# Patient Record
Sex: Female | Born: 1995 | Race: White | Hispanic: No | Marital: Single | Smoking: Former smoker
Health system: Southern US, Community
[De-identification: ages and names within clinical notes are randomized; demographics above are authoritative.]

## PROBLEM LIST (undated history)

## (undated) DIAGNOSIS — Z789 Other specified health status: Secondary | ICD-10-CM

## (undated) DIAGNOSIS — Z8659 Personal history of other mental and behavioral disorders: Secondary | ICD-10-CM

## (undated) DIAGNOSIS — F32A Depression, unspecified: Secondary | ICD-10-CM

## (undated) DIAGNOSIS — Z8759 Personal history of other complications of pregnancy, childbirth and the puerperium: Secondary | ICD-10-CM

## (undated) DIAGNOSIS — D649 Anemia, unspecified: Secondary | ICD-10-CM

## (undated) DIAGNOSIS — F419 Anxiety disorder, unspecified: Secondary | ICD-10-CM

## (undated) DIAGNOSIS — F4310 Post-traumatic stress disorder, unspecified: Secondary | ICD-10-CM

## (undated) HISTORY — DX: Personal history of other mental and behavioral disorders: Z87.59

## (undated) HISTORY — DX: Personal history of other mental and behavioral disorders: Z86.59

## (undated) HISTORY — DX: Post-traumatic stress disorder, unspecified: F43.10

## (undated) HISTORY — DX: Other specified health status: Z78.9

## (undated) HISTORY — PX: INDUCED ABORTION: SHX677

## (undated) HISTORY — DX: Depression, unspecified: F32.A

## (undated) HISTORY — DX: Anxiety disorder, unspecified: F41.9

## (undated) NOTE — *Deleted (*Deleted)
History     CSN: [id number]  Arrival date and time: 08/07/20 1331   First Provider Initiated Contact with Patient 08/07/20 1451      Chief Complaint  Patient presents with  . Vaginal Bleeding   HPI  Patient is G4P2 at [redacted]w[redacted]d presents for vaginal bleeding. Patient was diagnosed with placental abruption on 08/01/2020 in [facility]U. Patient reports she started having vaginal bleeding yesterday that progressively worsened last night and noted passing large clots. Patient notes she changed her pad 4 times throughout the night last night and twice today. She states the bleeding has slowed down but she still continues to have bleeding. Patient reports mild lower abdominal pain that radiates to her lower back. Patient denies dizziness, HA, nausea, vomiting, dysuria, frequency, or leg swelling. Patient notes she has recently felt flutters from the baby and continues to feel the movement.  OB History     Gravida  4   Para  2   Term  2   Preterm  0   AB  1   Living  2      SAB  0   TAB  1   Ectopic  0   Multiple  0   Live Births  2        Obstetric Comments  C/s 1- breech C/s 2 attempted TOLAC baby did not tolerate         Past Medical History:  Diagnosis Date  . Anemia   . Medical history non-contributory     Past Surgical History:  Procedure Laterality Date  . CESAREAN SECTION    . CESAREAN SECTION N/A 06/04/2019   Procedure: CESAREAN SECTION;  Surgeon: [place] Bing, MD;  Location: [facility]S;  Service: Obstetrics;  Laterality: N/A;  . INDUCED ABORTION      Family History  Problem Relation Age of Onset  . Healthy Mother   . Healthy Father     Social History   Tobacco Use  . Smoking status: Former Smoker    Packs/day: 0.25    Quit date: 06/01/2020    Years since quitting: 0.1  . Smokeless tobacco: Never Used  Vaping Use  . Vaping Use: Never used  Substance Use Topics  . Alcohol use: Never  . Drug use: Never    Allergies:  Allergies  Allergen  Reactions  . Sulfa Antibiotics Hives  . Nickel Rash    Medications Prior to Admission  Medication Sig Dispense Refill Last Dose  . acetaminophen (TYLENOL) 325 MG tablet Take 650 mg by mouth as needed (pain).    Past Week at Unknown time  . Prenatal Vit-Fe Fumarate-FA (PRENATAL MULTIVITAMIN) TABS tablet Take 1 tablet by mouth daily at 12 noon.   08/07/2020 at Unknown time    Review of Systems  Constitutional: Negative for fever.  Respiratory: Negative for shortness of breath.   Cardiovascular: Negative for chest pain and leg swelling.  Gastrointestinal: Positive for abdominal pain. Negative for diarrhea, nausea and vomiting.  Genitourinary: Positive for pelvic pain and vaginal bleeding. Negative for dysuria, flank pain, frequency, vaginal discharge and vaginal pain.  Musculoskeletal: Positive for back pain (Low back pain).  Neurological: Negative for dizziness and headaches.   Physical Exam   Blood pressure 103/66, pulse 78, temperature 98 F (36.7 C), resp. rate 18, weight 56.2 kg, last menstrual period 02/23/2020, SpO2 100 %, unknown if currently breastfeeding.  Physical Exam Exam conducted with a chaperone present.  Constitutional:      Appearance: Normal appearance.  HENT:  Head: Normocephalic and atraumatic.  Pulmonary:     Effort: Pulmonary effort is normal.  Abdominal:     General: There is no distension.     Palpations: Abdomen is soft.     Tenderness: There is abdominal tenderness. There is no right CVA tenderness or left CVA tenderness.  Genitourinary:    General: Normal vulva.     Cervix: Erythema and cervical bleeding present. No friability.  Musculoskeletal:     Cervical back: Normal range of motion and neck supple.     Right lower leg: No edema.     Left lower leg: No edema.  Skin:    General: Skin is warm and dry.  Neurological:     General: No focal deficit present.     Mental Status: She is alert and oriented to person, place, and time.   Psychiatric:        Mood and Affect: Mood normal.        Behavior: Behavior normal.     MAU Course  Procedures Results for orders placed or performed during the hospital encounter of 08/07/20 (from the past 24 hour(s))  Urinalysis, Routine w reflex microscopic Urine, Clean Catch     Status: Abnormal   Collection Time: 08/07/20  2:20 PM  Result Value Ref Range   Color, Urine YELLOW YELLOW   APPearance HAZY (A) CLEAR   Specific Gravity, Urine 1.028 1.005 - 1.030   pH 5.0 5.0 - 8.0   Glucose, UA NEGATIVE NEGATIVE mg/dL   Hgb urine dipstick MODERATE (A) NEGATIVE   Bilirubin Urine NEGATIVE NEGATIVE   Ketones, ur NEGATIVE NEGATIVE mg/dL   Protein, ur NEGATIVE NEGATIVE mg/dL   Nitrite NEGATIVE NEGATIVE   Leukocytes,Ua NEGATIVE NEGATIVE   RBC / HPF >50 (H) 0 - 5 RBC/hpf   WBC, UA 0-5 0 - 5 WBC/hpf   Bacteria, UA NONE SEEN NONE SEEN   Squamous Epithelial / LPF 0-5 0 - 5   Mucus PRESENT   Wet prep, genital     Status: Abnormal   Collection Time: 08/07/20  3:08 PM  Result Value Ref Range   Yeast Wet Prep HPF POC NONE SEEN NONE SEEN   Trich, Wet Prep NONE SEEN NONE SEEN   Clue Cells Wet Prep HPF POC PRESENT (A) NONE SEEN   WBC, Wet Prep HPF POC MANY (A) NONE SEEN   Sperm NONE SEEN   CBC     Status: Abnormal   Collection Time: 08/07/20  3:35 PM  Result Value Ref Range   WBC 12.6 (H) 4.0 - 10.5 K/uL   RBC 4.09 3.87 - 5.11 MIL/uL   Hemoglobin 9.2 (L) 12.0 - 15.0 g/dL   HCT 40.1 (L) 36 - 46 %   MCV 74.3 (L) 80.0 - 100.0 fL   MCH 22.5 (L) 26.0 - 34.0 pg   MCHC 30.3 30.0 - 36.0 g/dL   RDW 02.7 (H) 25.3 - 66.4 %   Platelets 312 150 - 400 K/uL   nRBC 0.0 0.0 - 0.2 %   MDM Patient presents with vaginal bleeding and history of known placental abruption.  UA showed presence of RBCs. Wet prep completed and showed clue cells indicating patient has BV. GC/chlamydia results pending. CBC shows Hgb stable at 9.2 as compared to 9.1 six days ago.   [place] completed to evaluate bleeding and  placental abruption. [place] final results not completed, but preliminary results showed oligohydramnios with largest pocket of fluid being 1.5cm.   Assessment and Plan  1. Vaginal  bleeding with known placental abruption. 2. Bacterial vaginosis  Dr. Shawnie Pons consulted suggested to inform the patient of the preliminary ultrasound findings and tell her that her water has most likely broken, and pregnancy is at a high risk for spontaneous abortion.  Patient is to be instructed to follow up with a provider in office tomorrow to review final [place] results. Patient needs to follow up in one week for repeat ultrasound. Patient to be instructed to return to MAU if bleeding or vaginal discharge increases or if she passes more clots. Patient to be treated with Flagyl 500mg  BID x7days for BV.  Patient left AMA before instructions were given to her after extensive conversation about the importance of waiting for the results as the preliminary findings are concerning for abnormal findings.  [place], Gigi Gin- Student 08/07/2020, 4:50 PM   Attestation of Supervision of Student:  I confirm that I have verified the information documented in the physician assistant student's note and that I have also personally reperformed the history, physical exam and all medical decision making activities.  I have verified that all services and findings are accurately documented in this student's note; and I agree with management and plan as outlined in the documentation. I have also made any necessary editorial changes. I was present for the entire encounter.      *Consult with Dr. 13/06/2020 @ [phone] - requesting U/S report be finalized. Verbal report given by Dr. [id number] stating that AFI was 1.5 cm. Dr. Grace Bushy will get final report read in 1 hour; no computer access at this time. Dr. Grace Bushy notified of Dr. Shawnie Pons verbal report and recommends that patient should see provider tomorrow and strict bleeding precautions.  Message  sent to [facility] Admin Pool to call patient to get scheduled for appt with provider.  Ivar Bury, CNM [facility], [facility] 08/07/2020 7:19 PM

---

## 2019-01-23 DIAGNOSIS — Z3480 Encounter for supervision of other normal pregnancy, unspecified trimester: Secondary | ICD-10-CM | POA: Diagnosis not present

## 2019-01-23 LAB — OB RESULTS CONSOLE HIV ANTIBODY (ROUTINE TESTING): HIV: NONREACTIVE

## 2019-01-23 LAB — OB RESULTS CONSOLE RUBELLA ANTIBODY, IGM: Rubella: IMMUNE

## 2019-01-23 LAB — OB RESULTS CONSOLE HEPATITIS B SURFACE ANTIGEN: Hepatitis B Surface Ag: NEGATIVE

## 2019-01-24 DIAGNOSIS — Z3480 Encounter for supervision of other normal pregnancy, unspecified trimester: Secondary | ICD-10-CM | POA: Diagnosis not present

## 2019-02-14 DIAGNOSIS — Z3480 Encounter for supervision of other normal pregnancy, unspecified trimester: Secondary | ICD-10-CM | POA: Diagnosis not present

## 2019-03-23 ENCOUNTER — Emergency Department (HOSPITAL_COMMUNITY)
Admission: EM | Admit: 2019-03-23 | Discharge: 2019-03-23 | Disposition: A | Discharge status: Home / Self Care | Payer: Medicaid Other | Attending: Emergency Medicine | Admitting: Emergency Medicine

## 2019-03-23 ENCOUNTER — Encounter (HOSPITAL_COMMUNITY): Payer: Self-pay

## 2019-03-23 ENCOUNTER — Other Ambulatory Visit: Payer: Self-pay

## 2019-03-23 DIAGNOSIS — O9989 Other specified diseases and conditions complicating pregnancy, childbirth and the puerperium: Secondary | ICD-10-CM | POA: Insufficient documentation

## 2019-03-23 DIAGNOSIS — K029 Dental caries, unspecified: Secondary | ICD-10-CM

## 2019-03-23 DIAGNOSIS — O99333 Smoking (tobacco) complicating pregnancy, third trimester: Secondary | ICD-10-CM | POA: Insufficient documentation

## 2019-03-23 DIAGNOSIS — Z3A30 30 weeks gestation of pregnancy: Secondary | ICD-10-CM | POA: Diagnosis not present

## 2019-03-23 DIAGNOSIS — F17200 Nicotine dependence, unspecified, uncomplicated: Secondary | ICD-10-CM | POA: Insufficient documentation

## 2019-03-23 DIAGNOSIS — K0889 Other specified disorders of teeth and supporting structures: Secondary | ICD-10-CM

## 2019-03-23 MED ORDER — CLINDAMYCIN HCL 150 MG PO CAPS: 300.0000 mg | ORAL_CAPSULE | Freq: Three times a day (TID) | ORAL | 0 refills | Status: AC

## 2019-03-23 NOTE — ED Provider Notes (Signed)
[facility] DEPT Provider Note   CSN: [id number] Arrival date & time: 03/23/19  0093    History   Chief Complaint Chief Complaint  Patient presents with  . Dental Pain    HPI Darlene Reynolds is a 23 y.o. female who is currently [redacted] weeks pregnant presents today with left upper dental pain.  Patient reports that she recently moved to [facility] from [place], [place] and does not have a dentist in this area, she reports regular follow-ups regarding her pregnancy care.  Patient reports that she has had intermittent pain to her left upper molar for the past 6 months after the tooth cracked while eating.  She reports pain has gotten worse over the past 2-3 days describes a severe throbbing sensation constant worsened with chewing on the left side and improved slightly with Tylenol and Orajel.  Patient is requesting dentist referral today.  She denies any facial swelling, trismus, difficulty swallowing or breathing, fever/chills, nausea/vomiting or any additional concerns.    HPI  History reviewed. No pertinent past medical history.  There are no active problems to display for this patient.   Past Surgical History:  Procedure Laterality Date  . CESAREAN SECTION       OB History    Gravida  1   Para      Term      Preterm      AB      Living        SAB      TAB      Ectopic      Multiple      Live Births               Home Medications    Prior to Admission medications   Medication Sig Start Date End Date Taking? Authorizing Provider  clindamycin (CLEOCIN) 150 MG capsule Take 2 capsules (300 mg total) by mouth 3 (three) times daily for 7 days. 03/23/19 03/30/19  Deliah Boston, PA-C    Family History No family history on file.  Social History Social History   Tobacco Use  . Smoking status: Current Every Day Smoker    Packs/day: 0.50  . Smokeless tobacco: Never Used  Substance Use Topics  . Alcohol use: Never     Frequency: Never  . Drug use: Never     Allergies   Sulfa antibiotics   Review of Systems Review of Systems  Constitutional: Negative.  Negative for chills and fever.  HENT: Positive for dental problem. Negative for drooling, facial swelling, sore throat, tinnitus and trouble swallowing.   Gastrointestinal: Negative.  Negative for abdominal pain, nausea and vomiting.  Musculoskeletal: Negative for neck pain and neck stiffness.   Physical Exam Updated Vital Signs BP 117/73   Pulse 92   Temp 99 F (37.2 C) (Oral)   Resp 14   Ht 5\' 1"  (1.549 m)   Wt 68 kg   SpO2 100%   BMI 28.34 kg/m   Physical Exam Constitutional:      General: She is not in acute distress.    Appearance: Normal appearance. She is well-developed. She is not ill-appearing or diaphoretic.  HENT:     Head: Normocephalic and atraumatic.     Jaw: There is normal jaw occlusion. No trismus.     Right Ear: Tympanic membrane, ear canal and external ear normal.     Left Ear: Tympanic membrane, ear canal and external ear normal.     Nose: Nose  normal. No rhinorrhea.     Mouth/Throat:      Comments: Patient with overall poor dentition, multiple dental caries, cracked tooth #14.  The patient has normal phonation and is in control of secretions. No stridor.  Midline uvula without edema. Soft palate rises symmetrically. No tonsillar erythema, swelling or exudates. Tongue protrusion is normal, floor of mouth is soft. No trismus. No creptius on neck palpation. Mild gingival erythema without swelling or fluctuance noted. Mucus membranes moist. Eyes:     General: Vision grossly intact. Gaze aligned appropriately.     Pupils: Pupils are equal, round, and reactive to light.  Neck:     Musculoskeletal: Normal range of motion.     Trachea: Trachea and phonation normal. No tracheal deviation.  Pulmonary:     Effort: Pulmonary effort is normal. No respiratory distress.  Abdominal:     Comments: Pregnant   Musculoskeletal: Normal range of motion.  Skin:    General: Skin is warm and dry.  Neurological:     Mental Status: She is alert.     GCS: GCS eye subscore is 4. GCS verbal subscore is 5. GCS motor subscore is 6.     Comments: Speech is clear and goal oriented, follows commands Major Cranial nerves without deficit, no facial droop Moves extremities without ataxia, coordination intact  Psychiatric:        Behavior: Behavior normal.    ED Treatments / Results  Labs (all labs ordered are listed, but only abnormal results are displayed) Labs Reviewed - No data to display  EKG None  Radiology No results found.  Procedures Procedures (including critical care time)  Medications Ordered in ED Medications - No data to display   Initial Impression / Assessment and Plan / ED Course  I have reviewed the triage vital signs and the nursing notes.  Pertinent labs & imaging results that were available during my care of the patient were reviewed by me and considered in my medical decision making (see chart for details).     23 year old, [redacted] weeks pregnant otherwise healthy presents today for left upper dental pain for approximately 6 months.  Patient with very poor dentition overall with multiple dental caries and cracked tooth present at tooth #14. No signs or symptoms of dental abscess, no swelling/erythema/tenderness of the gums.  Patient is well-appearing, afebrile, nontoxic, speaking well.  Patient able to swallow without pain.  No signs of swelling or concern for Ludwig's angina/Peritonsilar abscess/Retropharyngeal abscess or other deep tissue infections.  No sign of swelling of the neck, patient has good range of motion of the neck, no trismus.  Will treat with OTC tylenol, Oragel and Clindamycin 300mg  TID x 7 days and given dental referral. On call dentist Dr. Mart PiggsLocklear's contact information provided.  At this time there does not appear to be any evidence of an acute emergency medical  condition and the patient appears stable for discharge with appropriate outpatient follow up. Diagnosis was discussed with patient who verbalizes understanding of care plan and is agreeable to discharge. I have discussed return precautions with patient who verbalizes understanding of return precautions. Patient encouraged to follow-up with their PCP and dentist. All questions answered.   Note: Portions of this report may have been transcribed using voice recognition software. Every effort was made to ensure accuracy; however, inadvertent computerized transcription errors may still be present. Final Clinical Impressions(s) / ED Diagnoses   Final diagnoses:  Pain, dental  Dental caries    ED Discharge Orders  Ordered    clindamycin (CLEOCIN) 150 MG capsule  3 times daily     03/23/19 0826           Bill SalinasMorelli, Martavious Hartel A, PA-C 03/23/19 [id number]    Raeford RazorKohut, Stephen, MD 03/23/19 [phone]

## 2019-03-23 NOTE — ED Triage Notes (Signed)
Pt states she has a hole in her left upper molar, causing her pain and swelling. Does not have a Pharmacist, community.

## 2019-03-23 NOTE — Discharge Instructions (Addendum)
You have been diagnosed today with Dental Pain.  At this time there does not appear to be the presence of an emergent medical condition, however there is always the potential for conditions to change. Please read and follow the below instructions.  Please return to the Emergency Department immediately for any new or worsening symptoms. Please be sure to follow up with your Primary Care Provider within one week regarding your visit today; please call their office to schedule an appointment even if you are feeling better for a follow-up visit. Please call the dentist Dr. Haig Prophet today after you leave to schedule a follow-up appointment regarding your dental pain.  He is on call today so you must call him this morning to schedule your appointment. Please take the antibiotic clindamycin as prescribed to help with your symptoms.  You may continue using over-the-counter Orajel to help with pain.  You may continue using over-the-counter Tylenol as directed on the packaging to help with your symptoms.  Get help right away if: You cannot open your mouth. You are having trouble breathing or swallowing. You have a fever. Your face, neck, or jaw is swollen. You have any new/concerning or worsening symptoms  Please read the additional information packets attached to your discharge summary.  Do not take your medicine if  develop an itchy rash, swelling in your mouth or lips, or difficulty breathing; call 911 and seek immediate emergency medical attention if this occurs.

## 2019-05-08 ENCOUNTER — Other Ambulatory Visit: Payer: Self-pay | Admitting: Obstetrics and Gynecology

## 2019-05-08 DIAGNOSIS — Z3009 Encounter for other general counseling and advice on contraception: Secondary | ICD-10-CM | POA: Diagnosis not present

## 2019-05-08 DIAGNOSIS — Z3483 Encounter for supervision of other normal pregnancy, third trimester: Secondary | ICD-10-CM | POA: Diagnosis not present

## 2019-05-08 DIAGNOSIS — O365930 Maternal care for other known or suspected poor fetal growth, third trimester, not applicable or unspecified: Secondary | ICD-10-CM | POA: Diagnosis not present

## 2019-05-08 DIAGNOSIS — Z0389 Encounter for observation for other suspected diseases and conditions ruled out: Secondary | ICD-10-CM | POA: Diagnosis not present

## 2019-05-08 DIAGNOSIS — Z1388 Encounter for screening for disorder due to exposure to contaminants: Secondary | ICD-10-CM | POA: Diagnosis not present

## 2019-05-08 DIAGNOSIS — O283 Abnormal ultrasonic finding on antenatal screening of mother: Secondary | ICD-10-CM | POA: Diagnosis not present

## 2019-05-08 LAB — OB RESULTS CONSOLE RPR: RPR: NONREACTIVE

## 2019-05-08 LAB — OB RESULTS CONSOLE GBS: GBS: POSITIVE

## 2019-05-08 LAB — OB RESULTS CONSOLE GC/CHLAMYDIA
Chlamydia: NEGATIVE
Gonorrhea: NEGATIVE

## 2019-05-09 ENCOUNTER — Other Ambulatory Visit: Payer: Self-pay

## 2019-05-09 ENCOUNTER — Ambulatory Visit (HOSPITAL_COMMUNITY)
Admission: RE | Admit: 2019-05-09 | Discharge: 2019-05-09 | Disposition: A | Discharge status: Home / Self Care | Payer: Medicaid Other | Source: Ambulatory Visit | Attending: Obstetrics and Gynecology | Admitting: Obstetrics and Gynecology

## 2019-05-09 DIAGNOSIS — O99013 Anemia complicating pregnancy, third trimester: Secondary | ICD-10-CM | POA: Diagnosis not present

## 2019-05-09 DIAGNOSIS — Z3A00 Weeks of gestation of pregnancy not specified: Secondary | ICD-10-CM | POA: Insufficient documentation

## 2019-05-09 DIAGNOSIS — D649 Anemia, unspecified: Secondary | ICD-10-CM | POA: Insufficient documentation

## 2019-05-09 DIAGNOSIS — Z3483 Encounter for supervision of other normal pregnancy, third trimester: Secondary | ICD-10-CM | POA: Diagnosis not present

## 2019-05-09 MED ORDER — SODIUM CHLORIDE 0.9 % IV SOLN
510.0000 mg | INTRAVENOUS | Status: DC
  Administered 2019-05-09: 510 mg via INTRAVENOUS
  Filled 2019-05-09: qty 17

## 2019-05-09 NOTE — Discharge Instructions (Signed)

## 2019-05-15 ENCOUNTER — Encounter (HOSPITAL_COMMUNITY): Payer: Self-pay

## 2019-05-16 ENCOUNTER — Ambulatory Visit (HOSPITAL_COMMUNITY)
Admission: RE | Admit: 2019-05-16 | Discharge: 2019-05-16 | Disposition: A | Discharge status: Home / Self Care | Payer: Medicaid Other | Source: Ambulatory Visit | Attending: Obstetrics and Gynecology | Admitting: Obstetrics and Gynecology

## 2019-05-16 ENCOUNTER — Other Ambulatory Visit: Payer: Self-pay

## 2019-05-16 DIAGNOSIS — Z3A00 Weeks of gestation of pregnancy not specified: Secondary | ICD-10-CM | POA: Diagnosis not present

## 2019-05-16 DIAGNOSIS — O99013 Anemia complicating pregnancy, third trimester: Secondary | ICD-10-CM | POA: Insufficient documentation

## 2019-05-16 DIAGNOSIS — D649 Anemia, unspecified: Secondary | ICD-10-CM | POA: Diagnosis not present

## 2019-05-16 MED ORDER — SODIUM CHLORIDE 0.9 % IV SOLN
510.0000 mg | INTRAVENOUS | Status: DC
  Administered 2019-05-16: 510 mg via INTRAVENOUS
  Filled 2019-05-16: qty 17

## 2019-05-29 ENCOUNTER — Other Ambulatory Visit: Payer: Self-pay | Admitting: Family Medicine

## 2019-05-29 ENCOUNTER — Other Ambulatory Visit (HOSPITAL_COMMUNITY): Payer: Self-pay | Admitting: Nurse Practitioner

## 2019-05-29 DIAGNOSIS — Z3490 Encounter for supervision of normal pregnancy, unspecified, unspecified trimester: Secondary | ICD-10-CM

## 2019-05-29 DIAGNOSIS — Z3483 Encounter for supervision of other normal pregnancy, third trimester: Secondary | ICD-10-CM | POA: Diagnosis not present

## 2019-05-29 DIAGNOSIS — O480 Post-term pregnancy: Secondary | ICD-10-CM

## 2019-05-30 ENCOUNTER — Other Ambulatory Visit: Payer: Self-pay

## 2019-05-30 ENCOUNTER — Ambulatory Visit (HOSPITAL_COMMUNITY)
Admission: RE | Admit: 2019-05-30 | Discharge: 2019-05-30 | Disposition: A | Discharge status: Home / Self Care | Payer: Medicaid Other | Source: Ambulatory Visit | Attending: Obstetrics and Gynecology | Admitting: Obstetrics and Gynecology

## 2019-05-30 ENCOUNTER — Encounter (HOSPITAL_COMMUNITY): Payer: Self-pay

## 2019-05-30 ENCOUNTER — Ambulatory Visit (HOSPITAL_COMMUNITY): Payer: Medicaid Other | Admitting: *Deleted

## 2019-05-30 VITALS — BP 118/71 | HR 70

## 2019-05-30 DIAGNOSIS — O480 Post-term pregnancy: Secondary | ICD-10-CM | POA: Insufficient documentation

## 2019-05-30 DIAGNOSIS — Z3A40 40 weeks gestation of pregnancy: Secondary | ICD-10-CM | POA: Diagnosis not present

## 2019-05-30 NOTE — Procedures (Signed)
Darlene Reynolds 10/26/1995 [redacted]w[redacted]d  Fetus A Non-Stress Test Interpretation for 05/30/19  Indication: post dates  Fetal Heart Rate A Mode: External Baseline Rate (A): 125 bpm Variability: Moderate Accelerations: 15 x 15 Decelerations: None Multiple birth?: No  Uterine Activity Mode: Toco Contraction Frequency (min): one UC noted Contraction Duration (sec): 80 Contraction Quality: Mild Resting Tone Palpated: Relaxed Resting Time: Adequate  Interpretation (Fetal Testing) Nonstress Test Interpretation: Reactive Comments: FHR tracing rev'd by Dr. Gertie Exon

## 2019-06-01 ENCOUNTER — Telehealth (HOSPITAL_COMMUNITY): Payer: Self-pay | Admitting: *Deleted

## 2019-06-01 NOTE — Telephone Encounter (Signed)
Preadmission screen Message left on her mother's phone

## 2019-06-02 ENCOUNTER — Other Ambulatory Visit (HOSPITAL_COMMUNITY)
Admission: RE | Admit: 2019-06-02 | Discharge: 2019-06-02 | Disposition: A | Discharge status: Home / Self Care | Payer: Self-pay | Source: Ambulatory Visit

## 2019-06-03 ENCOUNTER — Other Ambulatory Visit: Payer: Self-pay

## 2019-06-03 ENCOUNTER — Encounter (HOSPITAL_COMMUNITY): Payer: Self-pay | Admitting: *Deleted

## 2019-06-03 ENCOUNTER — Inpatient Hospital Stay (HOSPITAL_COMMUNITY)
Admission: AD | Admit: 2019-06-03 | Discharge: 2019-06-07 | DRG: 788 | Disposition: A | Discharge status: Home / Self Care | Payer: Medicaid Other | Attending: Obstetrics and Gynecology | Admitting: Obstetrics and Gynecology

## 2019-06-03 DIAGNOSIS — O480 Post-term pregnancy: Secondary | ICD-10-CM | POA: Diagnosis not present

## 2019-06-03 DIAGNOSIS — Z20828 Contact with and (suspected) exposure to other viral communicable diseases: Secondary | ICD-10-CM | POA: Diagnosis present

## 2019-06-03 DIAGNOSIS — F17210 Nicotine dependence, cigarettes, uncomplicated: Secondary | ICD-10-CM | POA: Diagnosis present

## 2019-06-03 DIAGNOSIS — O99824 Streptococcus B carrier state complicating childbirth: Secondary | ICD-10-CM | POA: Diagnosis not present

## 2019-06-03 DIAGNOSIS — Z3490 Encounter for supervision of normal pregnancy, unspecified, unspecified trimester: Secondary | ICD-10-CM

## 2019-06-03 DIAGNOSIS — O34211 Maternal care for low transverse scar from previous cesarean delivery: Secondary | ICD-10-CM | POA: Diagnosis present

## 2019-06-03 DIAGNOSIS — D649 Anemia, unspecified: Secondary | ICD-10-CM | POA: Diagnosis present

## 2019-06-03 DIAGNOSIS — Z3A41 41 weeks gestation of pregnancy: Secondary | ICD-10-CM

## 2019-06-03 DIAGNOSIS — O99892 Other specified diseases and conditions complicating childbirth: Secondary | ICD-10-CM

## 2019-06-03 DIAGNOSIS — O9902 Anemia complicating childbirth: Secondary | ICD-10-CM | POA: Diagnosis present

## 2019-06-03 DIAGNOSIS — O99334 Smoking (tobacco) complicating childbirth: Secondary | ICD-10-CM | POA: Diagnosis present

## 2019-06-03 LAB — CBC
HCT: 34.6 % — ABNORMAL LOW (ref 36.0–46.0)
Hemoglobin: 10.3 g/dL — ABNORMAL LOW (ref 12.0–15.0)
MCH: 24.5 pg — ABNORMAL LOW (ref 26.0–34.0)
MCHC: 29.8 g/dL — ABNORMAL LOW (ref 30.0–36.0)
MCV: 82.2 fL (ref 80.0–100.0)
Platelets: 227 10*3/uL (ref 150–400)
RBC: 4.21 MIL/uL (ref 3.87–5.11)
WBC: 10.3 10*3/uL (ref 4.0–10.5)
nRBC: 0 % (ref 0.0–0.2)

## 2019-06-03 LAB — TYPE AND SCREEN
ABO/RH(D): B POS
Antibody Screen: NEGATIVE

## 2019-06-03 LAB — ABO/RH: ABO/RH(D): B POS

## 2019-06-03 LAB — POCT FERN TEST: POCT Fern Test: POSITIVE

## 2019-06-03 LAB — SARS CORONAVIRUS 2 BY RT PCR (HOSPITAL ORDER, PERFORMED IN ~~LOC~~ HOSPITAL LAB): SARS Coronavirus 2: NEGATIVE

## 2019-06-03 MED ORDER — SODIUM CHLORIDE 0.9 % IV SOLN
5.0000 10*6.[IU] | INTRAVENOUS | Status: AC
  Administered 2019-06-03: 5 10*6.[IU] via INTRAVENOUS
  Filled 2019-06-03: qty 5

## 2019-06-03 MED ORDER — LIDOCAINE HCL (PF) 1 % IJ SOLN: 30.0000 mL | INTRAMUSCULAR | Status: DC | PRN

## 2019-06-03 MED ORDER — PENICILLIN G 3 MILLION UNITS IVPB - SIMPLE MED
3.0000 10*6.[IU] | INTRAVENOUS | Status: DC
  Administered 2019-06-03 – 2019-06-04 (×2): 3 10*6.[IU] via INTRAVENOUS
  Filled 2019-06-03 (×2): qty 100

## 2019-06-03 MED ORDER — SOD CITRATE-CITRIC ACID 500-334 MG/5ML PO SOLN: 30.0000 mL | ORAL | Status: DC | PRN

## 2019-06-03 MED ORDER — OXYTOCIN 40 UNITS IN NORMAL SALINE INFUSION - SIMPLE MED: 2.5000 [IU]/h | INTRAVENOUS | Status: DC

## 2019-06-03 MED ORDER — OXYTOCIN BOLUS FROM INFUSION: 500.0000 mL | Freq: Once | INTRAVENOUS | Status: DC

## 2019-06-03 MED ORDER — LACTATED RINGERS IV SOLN
INTRAVENOUS | Status: DC
  Administered 2019-06-03: 20:00:00 via INTRAVENOUS

## 2019-06-03 MED ORDER — FLEET ENEMA 7-19 GM/118ML RE ENEM: 1.0000 | ENEMA | RECTAL | Status: DC | PRN

## 2019-06-03 MED ORDER — ACETAMINOPHEN 325 MG PO TABS
650.0000 mg | ORAL_TABLET | ORAL | Status: DC | PRN
  Administered 2019-06-03: 650 mg via ORAL

## 2019-06-03 MED ORDER — ONDANSETRON HCL 4 MG/2ML IJ SOLN: 4.0000 mg | Freq: Four times a day (QID) | INTRAMUSCULAR | Status: DC | PRN

## 2019-06-03 MED ORDER — LACTATED RINGERS IV SOLN
INTRAVENOUS | Status: DC
  Administered 2019-06-04: 03:00:00 via INTRAVENOUS

## 2019-06-03 MED ORDER — LACTATED RINGERS IV SOLN
500.0000 mL | INTRAVENOUS | Status: DC | PRN
  Administered 2019-06-04: 500 mL via INTRAVENOUS

## 2019-06-03 MED ORDER — LACTATED RINGERS IV SOLN: 500.0000 mL | INTRAVENOUS | Status: DC | PRN

## 2019-06-03 MED ORDER — OXYCODONE-ACETAMINOPHEN 5-325 MG PO TABS: 1.0000 | ORAL_TABLET | ORAL | Status: DC | PRN

## 2019-06-03 MED ORDER — OXYCODONE-ACETAMINOPHEN 5-325 MG PO TABS: 2.0000 | ORAL_TABLET | ORAL | Status: DC | PRN

## 2019-06-03 MED ORDER — ACETAMINOPHEN 325 MG PO TABS
650.0000 mg | ORAL_TABLET | ORAL | Status: DC | PRN
  Filled 2019-06-03: qty 2

## 2019-06-03 NOTE — Progress Notes (Signed)
Pt informed that the ultrasound is considered a limited OB ultrasound and is not intended to be a complete ultrasound exam.  Patient also informed that the ultrasound is not being completed with the intent of assessing for fetal or placental anomalies or any pelvic abnormalities.  Explained that the purpose of today's ultrasound is to assess for  presentation.  Patient acknowledges the purpose of the exam and the limitations of the study.    Cephalic  Liborio Saccente, NP   

## 2019-06-03 NOTE — H&P (Addendum)
OBSTETRIC ADMISSION HISTORY AND PHYSICAL  Darlene Reynolds is a 23 y.o. female G2P1001 with IUP at [redacted]w[redacted]d by LMP presenting for SROM.  Due for IOL postdates tonight at midnight. Water broke at 15:30 and leaked cloudy fluid. Denies blood in fluid. Followed by mucus plug. Fern test positive in MAU. Confirmed vertex presentation in MAU with [place]. Denies contractions at present. Reports good fetal movement. Denies vaginal bleeding. Denies headache, visual changes, chest pain, SOB, cough, fevers or RUQ pain. No voiding sx or change in bowel habit.  She received her prenatal care at [facility].  Support person in labor: partner  Ultrasounds . Anatomy U/S: normal  Prenatal History/Complications: Previous pregnancy-->c section 2/2 breech presentation  Anemia GBS positive   Past Medical History: Past Medical History:  Diagnosis Date  . Medical history non-contributory     Past Surgical History: Past Surgical History:  Procedure Laterality Date  . CESAREAN SECTION      Obstetrical History: OB History    Gravida  2   Para  1   Term  1   Preterm      AB      Living  1     SAB      TAB      Ectopic      Multiple      Live Births              Social History: Social History   Socioeconomic History  . Marital status: Single    Spouse name: Not on file  . Number of children: Not on file  . Years of education: Not on file  . Highest education level: Not on file  Occupational History  . Not on file  Social Needs  . Financial resource strain: Not on file  . Food insecurity    Worry: Not on file    Inability: Not on file  . Transportation needs    Medical: Not on file    Non-medical: Not on file  Tobacco Use  . Smoking status: Current Every Day Smoker    Packs/day: 0.25  . Smokeless tobacco: Never Used  Substance and Sexual Activity  . Alcohol use: Never    Frequency: Never  . Drug use: Never  . Sexual activity: Yes    Birth control/protection: None  Lifestyle   . Physical activity    Days per week: Not on file    Minutes per session: Not on file  . Stress: Not on file  Relationships  . Social Herbalist on phone: Not on file    Gets together: Not on file    Attends religious service: Not on file    Active member of club or organization: Not on file    Attends meetings of clubs or organizations: Not on file    Relationship status: Not on file  Other Topics Concern  . Not on file  Social History Narrative  . Not on file    Family History: History reviewed. No pertinent family history.  Allergies: Allergies  Allergen Reactions  . Sulfa Antibiotics Hives    Medications Prior to Admission  Medication Sig Dispense Refill Last Dose  . Acetaminophen (TYLENOL PO) Take by mouth.     . Prenatal Vit-Fe Fumarate-FA (PRENATAL VITAMIN PO) Take by mouth.        Review of Systems  All systems reviewed and negative except as stated in HPI  Blood pressure 108/73, pulse 97, temperature 98.4 F (36.9 C), temperature source  Oral, resp. rate 16, last menstrual period 08/20/2018, SpO2 99 %. General appearance: alert, cooperative and appears stated age Lungs: chest clear, no crackles or wheeze, no respiratory distress Heart: RRR, no gallops or rubs Abdomen: soft, non-tender; gravid abdomen, bowel sounds present  Pelvic: 2, thick, -3, posterior cervic  Extremities: Homans sign is negative, no sign of DVT Presentation: cephalic Fetal monitoring: 135bpm, mod variability, accels present, no decels Uterine activity: no uterine activity    Prenatal labs: ABO, Rh:  B pos Antibody:  negative Rubella:  immune RPR:   nonreactive HBsAg:   negative HIV:   non reactive GBS:    positive Glucola:142, 120, 103 Genetic screening:  Too late  Prenatal Transfer Tool  Maternal Diabetes: No Genetic Screening: Declined Maternal Ultrasounds/Referrals: Normal Fetal Ultrasounds or other Referrals:  None Maternal Substance Abuse:  Yes:  Type:  Marijuana Significant Maternal Medications:  None Significant Maternal Lab Results: Group B Strep positive  Results for orders placed or performed during the hospital encounter of 06/03/19 (from the past 24 hour(s))  POCT fern test   Collection Time: 06/03/19  6:13 PM  Result Value Ref Range   POCT Fern Test Positive = ruptured amniotic membanes     There are no active problems to display for this patient.   Assessment/Plan:  Darlene Reynolds is a 23 y.o. G2P1001 at 5125w0d here for SROM.  Labor: Normal light diet, whilst not on Pitocin Insert FB, with low dose pitocin if appropriate as TOLAC. No cytotec as contraindicated due to previous c section. --Pain control: Would like epidural when in active labor  Fetal Wellbeing:   --GBS (positive) started on pencillin  --Continuous fetal monitoring  --Circumcision desired: no  Postpartum Planning -- Breast and bottle -- Tdap: Given at 28 weeks   --Contraception: patch  Towanda OctavePoonam Patel, MD PGY-1, [facility]   OB FELLOW ATTESTATION  I have seen and examined this patient and agree with above documentation in the resident's note.  Zack SealMateo Lyndel Sarate, MD/[facility] OB Fellow  06/03/2019, 9:30 PM

## 2019-06-03 NOTE — MAU Note (Signed)
Darlene Reynolds is a 23 y.o. at [redacted]w[redacted]d here in MAU reporting: LOF since 1530, states she had a small trickle and it has continued to trickle. States the fluid is cloudy. No pain or bleeding. +FM. No recent IC  Onset of complaint: today at 1530  Pain score: 0/10  Vitals:   06/03/19 1737  BP: 108/73  Pulse: 97  Resp: 16  Temp: 98.4 F (36.9 C)  SpO2: 99%     FHT: +FM  Lab orders placed from triage: none

## 2019-06-03 NOTE — MAU Note (Signed)
covid swab collected

## 2019-06-04 ENCOUNTER — Inpatient Hospital Stay (HOSPITAL_COMMUNITY): Admission: AD | Admit: 2019-06-04 | Payer: Self-pay | Source: Home / Self Care | Admitting: Obstetrics and Gynecology

## 2019-06-04 ENCOUNTER — Inpatient Hospital Stay (HOSPITAL_COMMUNITY): Payer: Medicaid Other | Admitting: Anesthesiology

## 2019-06-04 ENCOUNTER — Encounter (HOSPITAL_COMMUNITY)
Admission: AD | Disposition: A | Discharge status: Home / Self Care | Payer: Self-pay | Source: Home / Self Care | Attending: Obstetrics and Gynecology

## 2019-06-04 ENCOUNTER — Inpatient Hospital Stay (HOSPITAL_COMMUNITY): Payer: Self-pay

## 2019-06-04 ENCOUNTER — Encounter (HOSPITAL_COMMUNITY): Payer: Self-pay

## 2019-06-04 DIAGNOSIS — O99824 Streptococcus B carrier state complicating childbirth: Secondary | ICD-10-CM

## 2019-06-04 DIAGNOSIS — O34211 Maternal care for low transverse scar from previous cesarean delivery: Secondary | ICD-10-CM

## 2019-06-04 DIAGNOSIS — Z3A41 41 weeks gestation of pregnancy: Secondary | ICD-10-CM

## 2019-06-04 DIAGNOSIS — O480 Post-term pregnancy: Secondary | ICD-10-CM

## 2019-06-04 LAB — RPR: RPR Ser Ql: NONREACTIVE

## 2019-06-04 SURGERY — Surgical Case
Anesthesia: General | Wound class: Clean Contaminated

## 2019-06-04 MED ORDER — OXYTOCIN 40 UNITS IN NORMAL SALINE INFUSION - SIMPLE MED: 2.5000 [IU]/h | INTRAVENOUS | Status: DC

## 2019-06-04 MED ORDER — OXYCODONE HCL 5 MG/5ML PO SOLN: 5.0000 mg | Freq: Once | ORAL | Status: DC | PRN

## 2019-06-04 MED ORDER — ONDANSETRON HCL 4 MG/2ML IJ SOLN: 4.0000 mg | Freq: Once | INTRAMUSCULAR | Status: DC | PRN

## 2019-06-04 MED ORDER — WITCH HAZEL-GLYCERIN EX PADS: 1.0000 "application " | MEDICATED_PAD | CUTANEOUS | Status: DC | PRN

## 2019-06-04 MED ORDER — ENOXAPARIN SODIUM 40 MG/0.4ML ~~LOC~~ SOLN
40.0000 mg | SUBCUTANEOUS | Status: DC
  Administered 2019-06-05 – 2019-06-07 (×3): 40 mg via SUBCUTANEOUS
  Filled 2019-06-04 (×3): qty 0.4

## 2019-06-04 MED ORDER — FENTANYL CITRATE (PF) 100 MCG/2ML IJ SOLN
INTRAMUSCULAR | Status: AC
  Filled 2019-06-04: qty 2

## 2019-06-04 MED ORDER — DEXAMETHASONE SODIUM PHOSPHATE 10 MG/ML IJ SOLN
INTRAMUSCULAR | Status: DC | PRN
  Administered 2019-06-04: 4 mg via INTRAVENOUS

## 2019-06-04 MED ORDER — SODIUM CHLORIDE 0.9 % IV SOLN
INTRAVENOUS | Status: AC
  Filled 2019-06-04: qty 500

## 2019-06-04 MED ORDER — OXYCODONE HCL 5 MG PO TABS
10.0000 mg | ORAL_TABLET | ORAL | Status: DC | PRN
  Administered 2019-06-04 – 2019-06-07 (×10): 10 mg via ORAL
  Filled 2019-06-04 (×9): qty 2

## 2019-06-04 MED ORDER — LACTATED RINGERS IV SOLN
INTRAVENOUS | Status: DC | PRN
  Administered 2019-06-04: 05:00:00 via INTRAVENOUS

## 2019-06-04 MED ORDER — TERBUTALINE SULFATE 1 MG/ML IJ SOLN
0.2500 mg | Freq: Once | INTRAMUSCULAR | Status: AC
  Administered 2019-06-04: 0.25 mg via SUBCUTANEOUS

## 2019-06-04 MED ORDER — OXYCODONE HCL 5 MG PO TABS: 5.0000 mg | ORAL_TABLET | Freq: Once | ORAL | Status: DC | PRN

## 2019-06-04 MED ORDER — IBUPROFEN 800 MG PO TABS
800.0000 mg | ORAL_TABLET | Freq: Three times a day (TID) | ORAL | Status: DC | PRN
  Administered 2019-06-04 – 2019-06-07 (×11): 800 mg via ORAL
  Filled 2019-06-04 (×10): qty 1

## 2019-06-04 MED ORDER — EPHEDRINE SULFATE 50 MG/ML IJ SOLN
INTRAMUSCULAR | Status: DC | PRN
  Administered 2019-06-04 (×2): 5 mg via INTRAVENOUS

## 2019-06-04 MED ORDER — KETOROLAC TROMETHAMINE 30 MG/ML IJ SOLN: 30.0000 mg | Freq: Once | INTRAMUSCULAR | Status: DC | PRN

## 2019-06-04 MED ORDER — SODIUM CHLORIDE 0.9 % IV SOLN
INTRAVENOUS | Status: DC | PRN
  Administered 2019-06-04: 500 mg via INTRAVENOUS

## 2019-06-04 MED ORDER — SIMETHICONE 80 MG PO CHEW
80.0000 mg | CHEWABLE_TABLET | ORAL | Status: DC | PRN
  Administered 2019-06-04: 80 mg via ORAL

## 2019-06-04 MED ORDER — COCONUT OIL OIL: 1.0000 "application " | TOPICAL_OIL | Status: DC | PRN

## 2019-06-04 MED ORDER — MENTHOL 3 MG MT LOZG
1.0000 | LOZENGE | OROMUCOSAL | Status: DC | PRN
  Administered 2019-06-04: 3 mg via ORAL
  Filled 2019-06-04: qty 9

## 2019-06-04 MED ORDER — FENTANYL CITRATE (PF) 100 MCG/2ML IJ SOLN
25.0000 ug | INTRAMUSCULAR | Status: DC | PRN
  Administered 2019-06-04: 25 ug via INTRAVENOUS
  Administered 2019-06-04: 50 ug via INTRAVENOUS
  Administered 2019-06-04: 25 ug via INTRAVENOUS

## 2019-06-04 MED ORDER — OXYCODONE HCL 5 MG PO TABS
5.0000 mg | ORAL_TABLET | ORAL | Status: DC | PRN
  Filled 2019-06-04 (×2): qty 1

## 2019-06-04 MED ORDER — DIBUCAINE (PERIANAL) 1 % EX OINT: 1.0000 "application " | TOPICAL_OINTMENT | CUTANEOUS | Status: DC | PRN

## 2019-06-04 MED ORDER — FENTANYL CITRATE (PF) 250 MCG/5ML IJ SOLN: INTRAMUSCULAR | Status: DC | PRN

## 2019-06-04 MED ORDER — CEFAZOLIN SODIUM-DEXTROSE 2-4 GM/100ML-% IV SOLN
INTRAVENOUS | Status: AC
  Filled 2019-06-04: qty 100

## 2019-06-04 MED ORDER — TERBUTALINE SULFATE 1 MG/ML IJ SOLN
INTRAMUSCULAR | Status: AC
  Filled 2019-06-04: qty 1

## 2019-06-04 MED ORDER — LACTATED RINGERS IV SOLN
INTRAVENOUS | Status: DC
  Administered 2019-06-04: 10:00:00 via INTRAVENOUS

## 2019-06-04 MED ORDER — OXYCODONE HCL 5 MG PO TABS: 5.0000 mg | ORAL_TABLET | Freq: Four times a day (QID) | ORAL | Status: DC | PRN

## 2019-06-04 MED ORDER — ONDANSETRON HCL 4 MG/2ML IJ SOLN
INTRAMUSCULAR | Status: DC | PRN
  Administered 2019-06-04: 4 mg via INTRAVENOUS

## 2019-06-04 MED ORDER — SIMETHICONE 80 MG PO CHEW
80.0000 mg | CHEWABLE_TABLET | Freq: Three times a day (TID) | ORAL | Status: DC
  Administered 2019-06-04 – 2019-06-07 (×9): 80 mg via ORAL
  Filled 2019-06-04 (×10): qty 1

## 2019-06-04 MED ORDER — PHENYLEPHRINE HCL (PRESSORS) 10 MG/ML IV SOLN
INTRAVENOUS | Status: DC | PRN
  Administered 2019-06-04 (×2): 80 ug via INTRAVENOUS

## 2019-06-04 MED ORDER — MIDAZOLAM HCL 5 MG/5ML IJ SOLN
INTRAMUSCULAR | Status: DC | PRN
  Administered 2019-06-04: 2 mg via INTRAVENOUS

## 2019-06-04 MED ORDER — FENTANYL CITRATE (PF) 100 MCG/2ML IJ SOLN: 100.0000 ug | INTRAMUSCULAR | Status: DC | PRN

## 2019-06-04 MED ORDER — SENNOSIDES-DOCUSATE SODIUM 8.6-50 MG PO TABS
2.0000 | ORAL_TABLET | ORAL | Status: DC
  Administered 2019-06-04 – 2019-06-06 (×3): 2 via ORAL
  Filled 2019-06-04 (×3): qty 2

## 2019-06-04 MED ORDER — SODIUM CHLORIDE 0.9 % IV SOLN
INTRAVENOUS | Status: DC | PRN
  Administered 2019-06-04: 40 [IU] via INTRAVENOUS

## 2019-06-04 MED ORDER — PRENATAL MULTIVITAMIN CH
1.0000 | ORAL_TABLET | Freq: Every day | ORAL | Status: DC
  Administered 2019-06-04 – 2019-06-07 (×4): 1 via ORAL
  Filled 2019-06-04 (×4): qty 1

## 2019-06-04 MED ORDER — FENTANYL CITRATE (PF) 250 MCG/5ML IJ SOLN
INTRAMUSCULAR | Status: DC | PRN
  Administered 2019-06-04: 100 ug via INTRAVENOUS
  Administered 2019-06-04: 250 ug via INTRAVENOUS
  Administered 2019-06-04: 100 ug via INTRAVENOUS

## 2019-06-04 MED ORDER — ACETAMINOPHEN 325 MG PO TABS
650.0000 mg | ORAL_TABLET | Freq: Four times a day (QID) | ORAL | Status: DC | PRN
  Administered 2019-06-04 – 2019-06-07 (×10): 650 mg via ORAL
  Filled 2019-06-04 (×12): qty 2

## 2019-06-04 MED ORDER — SIMETHICONE 80 MG PO CHEW
80.0000 mg | CHEWABLE_TABLET | ORAL | Status: DC
  Administered 2019-06-04 – 2019-06-05 (×2): 80 mg via ORAL
  Filled 2019-06-04 (×3): qty 1

## 2019-06-04 MED ORDER — SODIUM CHLORIDE 0.9 % IV SOLN
INTRAVENOUS | Status: DC | PRN
  Administered 2019-06-04: 05:00:00 via INTRAVENOUS

## 2019-06-04 MED ORDER — TETANUS-DIPHTH-ACELL PERTUSSIS 5-2.5-18.5 LF-MCG/0.5 IM SUSP: 0.5000 mL | Freq: Once | INTRAMUSCULAR | Status: DC

## 2019-06-04 MED ORDER — DIPHENHYDRAMINE HCL 25 MG PO CAPS: 25.0000 mg | ORAL_CAPSULE | Freq: Four times a day (QID) | ORAL | Status: DC | PRN

## 2019-06-04 MED ORDER — FENTANYL CITRATE (PF) 100 MCG/2ML IJ SOLN
100.0000 ug | Freq: Once | INTRAMUSCULAR | Status: AC
  Administered 2019-06-04: 100 ug via INTRAVENOUS

## 2019-06-04 SURGICAL SUPPLY — 34 items
BENZOIN TINCTURE PRP APPL 2/3 (GAUZE/BANDAGES/DRESSINGS) ×3 IMPLANT
CANISTER SUCT 3000ML PPV (MISCELLANEOUS) ×3 IMPLANT
CHLORAPREP W/TINT 26ML (MISCELLANEOUS) ×3 IMPLANT
CLOSURE WOUND 1/2 X4 (GAUZE/BANDAGES/DRESSINGS) ×1
DRSG OPSITE POSTOP 4X10 (GAUZE/BANDAGES/DRESSINGS) ×3 IMPLANT
ELECT REM PT RETURN 9FT ADLT (ELECTROSURGICAL) ×3
ELECTRODE REM PT RTRN 9FT ADLT (ELECTROSURGICAL) ×1 IMPLANT
EXTRACTOR VACUUM KIWI (MISCELLANEOUS) ×3 IMPLANT
GLOVE BIOGEL PI IND STRL 7.0 (GLOVE) ×2 IMPLANT
GLOVE BIOGEL PI IND STRL 7.5 (GLOVE) ×1 IMPLANT
GLOVE BIOGEL PI INDICATOR 7.0 (GLOVE) ×4
GLOVE BIOGEL PI INDICATOR 7.5 (GLOVE) ×2
GLOVE SKINSENSE NS SZ7.0 (GLOVE) ×2
GLOVE SKINSENSE STRL SZ7.0 (GLOVE) ×1 IMPLANT
GOWN STRL REUS W/ TWL LRG LVL3 (GOWN DISPOSABLE) ×2 IMPLANT
GOWN STRL REUS W/ TWL XL LVL3 (GOWN DISPOSABLE) ×1 IMPLANT
GOWN STRL REUS W/TWL LRG LVL3 (GOWN DISPOSABLE) ×4
GOWN STRL REUS W/TWL XL LVL3 (GOWN DISPOSABLE) ×2
NS IRRIG 1000ML POUR BTL (IV SOLUTION) ×3 IMPLANT
PACK C SECTION WH (CUSTOM PROCEDURE TRAY) ×3 IMPLANT
PAD ABD 7.5X8 STRL (GAUZE/BANDAGES/DRESSINGS) ×3 IMPLANT
PAD OB MATERNITY 4.3X12.25 (PERSONAL CARE ITEMS) ×3 IMPLANT
PAD PREP 24X48 CUFFED NSTRL (MISCELLANEOUS) ×3 IMPLANT
PENCIL SMOKE EVAC W/HOLSTER (ELECTROSURGICAL) ×3 IMPLANT
STRIP CLOSURE SKIN 1/2X4 (GAUZE/BANDAGES/DRESSINGS) ×2 IMPLANT
SUT MNCRL 0 VIOLET CTX 36 (SUTURE) ×2 IMPLANT
SUT MON AB 4-0 PS1 27 (SUTURE) ×3 IMPLANT
SUT MONOCRYL 0 CTX 36 (SUTURE) ×4
SUT PLAIN 2 0 XLH (SUTURE) ×3 IMPLANT
SUT VIC AB 0 CT1 36 (SUTURE) ×6 IMPLANT
SUT VIC AB 3-0 CT1 27 (SUTURE) ×2
SUT VIC AB 3-0 CT1 TAPERPNT 27 (SUTURE) ×1 IMPLANT
TOWEL OR 17X24 6PK STRL BLUE (TOWEL DISPOSABLE) ×6 IMPLANT
WATER STERILE IRR 1000ML POUR (IV SOLUTION) ×3 IMPLANT

## 2019-06-04 NOTE — Progress Notes (Signed)
LABOR PROGRESS NOTE  Darlene Reynolds is a 23 y.o. G2P1001 at [redacted]w[redacted]d  admitted for SROM, TOLAC.  Subjective: Starting to feel painful contractions in her back Foley still in place  Objective: BP 108/64   Pulse 75   Temp 98.3 F (36.8 C) (Oral)   Resp 16   Ht 5\' 1"  (1.549 m)   Wt 70.8 kg   LMP 08/20/2018   SpO2 99%   BMI 29.48 kg/m  or  Vitals:   06/03/19 2233 06/03/19 2334 06/04/19 0059 06/04/19 0145  BP: 122/80 117/69 (!) 97/55 108/64  Pulse: 68 67 62 75  Resp: 18 18 16 16   Temp:  98.3 F (36.8 C)    TempSrc:  Oral    SpO2:      Weight:      Height:         Dilation: 2.5 Effacement (%): Thick Cervical Position: Posterior Station: -3 Presentation: Vertex Exam by:: Resident MD  FHT: baseline rate 125, moderate varibility, +acel, -decel Toco: q6-8 min  Labs: Lab Results  Component Value Date   WBC 10.3 06/03/2019   HGB 10.3 (L) 06/03/2019   HCT 34.6 (L) 06/03/2019   MCV 82.2 06/03/2019   PLT 227 06/03/2019    Patient Active Problem List   Diagnosis Date Noted  . Encounter for planned induction of labor 06/03/2019    Assessment / Plan: 23 y.o. G2P1001 at [redacted]w[redacted]d here for SROM, TOLAC.  Labor: FB placed at 2045, still in place with traction. Plan to start pitocin at 6 AM if FB still in place, otherwise when FB is out.  Fetal Wellbeing:  Cat I Pain Control:  Plans epidural Anticipated MOD:  SVD GBS: positive on penicillin  Augustin Coupe, MD/MPH OB Fellow  06/04/2019, 2:43 AM

## 2019-06-04 NOTE — Lactation Note (Signed)
This note was copied from a baby's chart. Lactation Consultation Note  Patient Name: Darlene Reynolds [id number] Date: 06/04/2019    Initial visit at 6 hours of life. Infant has already gone to the breast 3 times. Mom is a P2 who nursed her 1st child for 10-12 weeks (she stopped lactating b/c she was working a job that didn't allow her time to pump and her milk supply decreased. Mom now works from home). Mom reports + breast changes w/pregnancy & says she has been seeing colostrum since 4 months pregnant. Her 1st child is 56 months old.   Mom says "[name]" is latching well & has already "drained a breast." Mom is comfortable with latch. Mom's only question was about obtaining a pump at discharge. I did mention the option of a [facility] loaner, which she will consider. A WIC referral has not been sent at this time as Mom will be working from home & there does not seem to be a need for her to pump at this time. Mom says that she & FOB recently moved here from [facility], [place].   Mom was made aware of O/P services and our phone # for post-discharge questions.   Matthias Hughs [facility] 06/04/2019, 10:57 AM

## 2019-06-04 NOTE — Anesthesia Preprocedure Evaluation (Signed)
Anesthesia Evaluation   Patient awake  Preop documentation limited or incomplete due to emergent nature of procedure.  History of Anesthesia Complications Negative for: history of anesthetic complications  Airway        Dental   Pulmonary Current Smoker,    Pulmonary exam normal        Cardiovascular negative cardio ROS Normal cardiovascular exam     Neuro/Psych negative neurological ROS     GI/Hepatic negative GI ROS, Neg liver ROS,   Endo/Other  negative endocrine ROS  Renal/GU negative Renal ROS  negative genitourinary   Musculoskeletal   Abdominal   Peds  Hematology negative hematology ROS (+)   Anesthesia Other Findings STAT C section for fetal bradycardia  Reproductive/Obstetrics (+) Pregnancy                             Anesthesia Physical Anesthesia Plan  ASA: III and emergent  Anesthesia Plan: General ETT, Rapid Sequence and Cricoid Pressure   Post-op Pain Management:    Induction:   PONV Risk Score and Plan: 4 or greater and Ondansetron, Dexamethasone, Midazolam and Treatment may vary due to age or medical condition  Airway Management Planned:   Additional Equipment:   Intra-op Plan:   Post-operative Plan:   Informed Consent:     Only emergency history available  Plan Discussed with:   Anesthesia Plan Comments:         Anesthesia Quick Evaluation

## 2019-06-04 NOTE — Op Note (Addendum)
Operative Note   SURGERY DATE: 06/04/2019  PRE-OP DIAGNOSIS:  *Pregnancy at 41 weeks 1 day gestation *Failed TOLAC  POST-OP DIAGNOSIS: same   PROCEDURE: Stat repeat low transverse cesarean section via Sandria Manly incision with double layer uterine closure  SURGEON: Surgeon(s) and Role:    * Aletha Halim, MD - Primary    * Clarnce Flock, MD - Fellow  ASSISTANT: n/a  ANESTHESIA: general  ESTIMATED BLOOD LOSS: 577 cc  DRAINS: 200 mL UOP via indwelling foley  TOTAL IV FLUIDS: 3150 mL crystalloid  VTE PROPHYLAXIS: SCDs to bilateral lower extremities  ANTIBIOTICS: Two grams of Cefazolin and 500mg  Azithromycin were given post-op  SPECIMENS: Placenta to pathology  COMPLICATIONS: None  INDICATIONS: Fetal bradycardia unresponsive to resuscitative measures.   FINDINGS: No intra-abdominal adhesions were noted. Grossly normal uterus, tubes and ovaries. Clear amniotic fluid, cephalic female infant, weight 3249gm, APGARs 4/10, intact placenta.  PROCEDURE IN DETAIL: The patient was evaluated on L&D for fetal bradycardia, she was given terbutaline without improvement and was called for a code cesarean. She was taken to the operating room where general anesthesia was administered. She was then prepped with betadine splash and draped in the normal fashion in the dorsal supine position with a leftward tilt.  After a time out was performed, a Sandria Manly incision was made with the scalpel and carried through to the underlying layer of fascia. The fascia was then incised at the midline and this incision was extended bluntly, and then the rectus was dissected off the fascia bluntly. The rectus muscles were then separated in the midline and the peritoneum was entered bluntly. The bladder blade was inserted and the vesicouterine peritoneum was identified.  A low transverse hysterotomy was made with the scalpel until the endometrial cavity was breached and the amniotic sac ruptured, yielding clear  amniotic fluid. This incision was extended bluntly and the infant's head, shoulders and body were delivered atraumatically.The cord was clamped x 2 and cut, and the infant was handed to the awaiting pediatricians.  The placenta was then gradually expressed from the uterus and then the uterus was exteriorized and cleared of all clots and debris. The hysterotomy was repaired with a running suture of 0 Monocryl. A second imbricating layer of 0 Monocryl suture was then placed. Excellent hemostasis was observed.   The uterus and adnexa were then returned to the abdomen, and the hysterotomy and all operative sites were reinspected and excellent hemostasis was noted after irrigation and suction of the abdomen with warm saline.  The peritoneum was closed with a running stitch of 3-0 Vicryl. The fascia was reapproximated with 0 Vicryl in a simple running fashion bilaterally. The subcutaneous layer was then reapproximated with interrupted sutures of 2-0 plain gut, and the skin was then closed with 4-0 monocryl, in a subcuticular fashion.  The patient  tolerated the procedure well. Sponge, lap, needle, and instrument counts were correct x 2. The patient was transferred to the recovery room awake, alert and breathing independently in stable condition.  Augustin Coupe, MD/MPH [place] for [facility])  Agree with above. I was present and scrubbed for the entire procedure.   Durene Romans MD Attending Center for [employer] farm manager)

## 2019-06-04 NOTE — Discharge Summary (Addendum)
Postpartum Discharge Summary    Patient Name: Darlene Reynolds DOB: Jul 27, 1996 MRN: [id number]  Date of admission: 06/03/2019 Delivering Provider: Aletha Halim   Date of discharge: 06/07/2019  Admitting diagnosis: Water broke Intrauterine pregnancy: [redacted]w[redacted]d    Secondary diagnosis:  Active Problems:   Encounter for planned induction of labor  Additional problems:  STAT Repeat Cesarean for fetal bradycardia     Discharge diagnosis: Term Pregnancy Delivered                                                                                                Post partum procedures:n/a  Augmentation: Foley Balloon  Complications: Hb 8.4 post operatively, down from 10.3. Start on PO Fe on 9/920  Hospital course:  Induction of Labor With Cesarean Section  23y.o. yo G2P1001 at 474w1das admitted to the hospital 06/03/2019 for induction of labor. Patient had a labor course significant for fetal bradycardia not resolved with terbutaline leading to a stat repeat cesarean. The patient went for cesarean section due to Non-Reassuring FHR, and delivered a Viable infant,06/04/2019  Membrane Rupture Time/Date: 3:30 PM ,06/03/2019   Details of operation can be found in separate operative Note.  Patient had an uncomplicated postpartum course. She is ambulating, tolerating a regular diet, passing flatus, and urinating well.  Patient is discharged home in stable condition on 06/07/19.                                   Delivery time: 4:39 AM    Magnesium Sulfate received: No BMZ received: No Rhophylac:N/A MMR:No Transfusion:No  Physical exam  Vitals:   06/06/19 0543 06/06/19 1432 06/06/19 2130 06/07/19 0515  BP: (!) 98/54 112/70 124/83 111/73  Pulse: 61 70 98 67  Resp: _0 Temp: 97.8 F (36.6 C) 98.2 F (36.8 C) 98.5 F (36.9 C) 98 F (36.7 C)  TempSrc: Oral Oral Oral Axillary  SpO2: 99%  100% 100%  Weight:      Height:       General: alert, cooperative and no distress  Cardiac: RRR, no  rubs or gallops Pulm: chest clear on ausc, no crackles or wheeze, no respiratory distress GI: abdo soft, mildly tender in lower abdomen, no guarding. Bowel sounds present Lochia: appropriate Uterine Fundus: firm Incision: No significant erythema, Dressing is clean, dry, and intact DVT Evaluation: No cords or calf tenderness. No significant calf/ankle edema. Labs: Lab Results  Component Value Date   WBC 11.3 (H) 06/05/2019   HGB 8.4 (L) 06/05/2019   HCT 27.2 (L) 06/05/2019   MCV 82.2 06/05/2019   PLT 192 06/05/2019   No flowsheet data found.  Discharge instruction: per After Visit Summary and "Baby and Me Booklet".  After visit meds:  Allergies as of 06/07/2019      Reactions   Sulfa Antibiotics Hives   Nickel Rash      Medication List    TAKE these medications   calcium carbonate 500 MG chewable tablet Commonly known as: TUMS - dosed in mg elemental  calcium Chew 12 tablets by mouth as needed for indigestion or heartburn.   docusate sodium 100 MG capsule Commonly known as: COLACE Take 1 capsule (100 mg total) by mouth 2 (two) times daily.   ferrous sulfate 325 (65 FE) MG tablet Take 1 tablet (325 mg total) by mouth 2 (two) times daily with a meal.   oxyCODONE 5 MG immediate release tablet Commonly known as: Oxy IR/ROXICODONE Take 1 tablet (5 mg total) by mouth every 6 (six) hours as needed for up to 5 days for severe pain.   PRENATAL VITAMIN PO Take by mouth.   TYLENOL PO Take by mouth.       Diet: routine diet  Activity: Advance as tolerated. Pelvic rest for 6 weeks.   Outpatient follow up:2 weeks Follow up Appt: Future Appointments  Date Time Provider [place]  06/21/2019 10:20 AM [place]  07/04/2019  2:35 PM [place], Dan Europe, DO [facility]   Follow up Visit: Follow-up Information    Department, [facility] Follow up.   Why: Call office to schedule your postpartum visit in 4-6 weeks Contact  information: [place] [id number] [place] for [facility] Follow up.   Specialty: Obstetrics and Gynecology Why: This office will call you to schedule an incision check in 2 weeks Contact information: [address] [place], [place] [id number] [place] [id number] [phone]           Please schedule this patient for Postpartum visit in: 2 weeks with the following provider: MD For C/S patients schedule nurse incision check in weeks 2 weeks: yes Low risk pregnancy complicated by: n/a Delivery mode:  CS Anticipated Birth Control:  Patch PP Procedures needed: Incision check  Schedule Integrated BH visit: no      Newborn Data: Live born female: 68g Birth Weight: 7 lb 2.6 oz (3249 g) APGAR: 4, 10  Newborn Delivery   Birth date/time: 06/04/2019 04:39:00 Delivery type: C-Section, Low Transverse Trial of labor: Yes C-section categorization: Repeat      Baby Feeding: Bottle and Breast Disposition:home with mother   06/07/2019 Darlene Haw, MD    Provider attestation I have seen and examined this patient and agree with above documentation in the resident's note.   Darlene Reynolds is a 23 y.o. G2P1001 s/p RCS.  Pain is well controlled. Plan for birth control is Ortho-Evra. Method of Feeding: breast & bottle  PE:  Gen: well appearing Heart: reg rate Lungs: normal WOB Fundus firm Ext: no pain, no edema  Recent Labs    06/05/19 0453  HGB 8.4*  HCT 27.2*    Assessment S/p RCS PPD # 2  Plan: - discharge home - postpartum care discussed -incision check at [place] in 2 weeks -pt to call [facility] to schedule her PP visit -HGB & vital signs stable. Pt asymptomatic, will send home on iron supplement   Darlene Guild, NP 11:26 AM

## 2019-06-04 NOTE — Transfer of Care (Signed)
Immediate Anesthesia Transfer of Care Note  Patient: Darlene Reynolds  Procedure(s) Performed: CESAREAN SECTION (N/A )  Patient Location: PACU  Anesthesia Type:General  Level of Consciousness: awake, alert  and oriented  Airway & Oxygen Therapy: Patient Spontanous Breathing and Patient connected to nasal cannula oxygen  Post-op Assessment: Report given to RN and Post -op Vital signs reviewed and stable  Post vital signs: Reviewed and stable  Last Vitals:  Vitals Value Taken Time  BP 113/67 06/04/19 0533  Temp    Pulse 74 06/04/19 0534  Resp 17 06/04/19 0534  SpO2 100 % 06/04/19 0534  Vitals shown include unvalidated device data.  Last Pain:  Vitals:   06/04/19 0401  TempSrc:   PainSc: 6          Complications: No apparent anesthesia complications

## 2019-06-05 LAB — CBC
HCT: 27.2 % — ABNORMAL LOW (ref 36.0–46.0)
Hemoglobin: 8.4 g/dL — ABNORMAL LOW (ref 12.0–15.0)
MCH: 25.4 pg — ABNORMAL LOW (ref 26.0–34.0)
MCHC: 30.9 g/dL (ref 30.0–36.0)
MCV: 82.2 fL (ref 80.0–100.0)
Platelets: 192 10*3/uL (ref 150–400)
RBC: 3.31 MIL/uL — ABNORMAL LOW (ref 3.87–5.11)
WBC: 11.3 10*3/uL — ABNORMAL HIGH (ref 4.0–10.5)
nRBC: 0 % (ref 0.0–0.2)

## 2019-06-05 MED ORDER — ONDANSETRON HCL 4 MG/2ML IJ SOLN: 4.0000 mg | Freq: Four times a day (QID) | INTRAMUSCULAR | Status: DC | PRN

## 2019-06-05 MED ORDER — DIPHENHYDRAMINE HCL 12.5 MG/5ML PO ELIX
12.5000 mg | ORAL_SOLUTION | Freq: Four times a day (QID) | ORAL | Status: DC | PRN
  Filled 2019-06-05: qty 5

## 2019-06-05 MED ORDER — DIPHENHYDRAMINE HCL 50 MG/ML IJ SOLN: 12.5000 mg | Freq: Four times a day (QID) | INTRAMUSCULAR | Status: DC | PRN

## 2019-06-05 MED ORDER — FERROUS SULFATE 325 (65 FE) MG PO TABS
325.0000 mg | ORAL_TABLET | Freq: Every day | ORAL | Status: DC
  Administered 2019-06-05 – 2019-06-07 (×3): 325 mg via ORAL
  Filled 2019-06-05 (×3): qty 1

## 2019-06-05 MED ORDER — FENTANYL 40 MCG/ML IV SOLN: INTRAVENOUS | Status: DC

## 2019-06-05 MED ORDER — NALOXONE HCL 0.4 MG/ML IJ SOLN: 0.4000 mg | INTRAMUSCULAR | Status: DC | PRN

## 2019-06-05 MED ORDER — SODIUM CHLORIDE 0.9% FLUSH: 9.0000 mL | INTRAVENOUS | Status: DC | PRN

## 2019-06-05 NOTE — Anesthesia Postprocedure Evaluation (Signed)
Anesthesia Post Note  Patient: Darlene Reynolds  Procedure(s) Performed: CESAREAN SECTION (N/A )     Patient location during evaluation: PACU Anesthesia Type: General Level of consciousness: awake and alert Pain management: pain level controlled Vital Signs Assessment: post-procedure vital signs reviewed and stable Respiratory status: spontaneous breathing, nonlabored ventilation and respiratory function stable Cardiovascular status: blood pressure returned to baseline and stable Postop Assessment: no apparent nausea or vomiting Anesthetic complications: no Comments: Sore throat    Last Vitals:  Vitals:   06/04/19 2300 06/05/19 0541  BP: 106/61 (!) 116/54  Pulse: (!) 59 60  Resp:  18  Temp: 36.7 C 36.7 C  SpO2: 100% 100%    Last Pain:  Vitals:   06/05/19 0541  TempSrc: Oral  PainSc:    Pain Goal:                   Lidia Collum

## 2019-06-05 NOTE — Progress Notes (Signed)
New complaint of uvula hurting. Uvula does not look red/inflammed to RN.  RN notified resident, resident stated she would go look at it.

## 2019-06-05 NOTE — Clinical Social Work Maternal (Signed)
CLINICAL SOCIAL WORK MATERNAL/CHILD NOTE  Patient Details  Name: Darlene Reynolds MRN: [id number] Date of Birth: 01-31-1996  Date:  06/05/2019  Clinical Social Worker Initiating Note:  Elijio Miles Date/Time: Initiated:  06/05/19/1324     Child's Name:  Darlene Reynolds   Biological Parents:  Mother, Father(Darlene Reynolds and Darlene Reynolds DOB: 11/16/1991)   Need for Interpreter:  None   Reason for Referral:  Current Substance Use/Substance Use During Pregnancy    Address:  [address]  [address]    Phone number:  [phone]      Additional phone number:   Household Members/Support Persons (HM/SP):   Household Member/Support Person 1, Household Member/Support Person 2, Household Member/Support Person 3   HM/SP Name Relationship DOB or Age  HM/SP -1 Kathlen Mody Son 12/16/2017  HM/SP -2 Darlene Reynolds FOB 11/16/1991  HM/SP -3   FOB's aunt    HM/SP -4        HM/SP -5        HM/SP -6        HM/SP -7        HM/SP -8          Natural Supports (not living in the home):  Extended Family, Parent   Professional Supports: None   Employment: Part-time   Type of Work: Museum/gallery curator   Education:  [place] arranged:    Museum/gallery curator Resources:      Other Resources:  [facility], Food Stamps    Cultural/Religious Considerations Which May Impact Care:    Strengths:  Ability to meet basic needs , Home prepared for child (Pediatrician list provided)   Psychotropic Medications:         Pediatrician:       Pediatrician List:   [place]      Pediatrician Fax Number:    Risk Factors/Current Problems:  Substance Use    Cognitive State:  Able to Concentrate , Alert , Linear Thinking , Insightful    Mood/Affect:  Bright , Calm , Comfortable , Happy , Interested , Relaxed    CSW Assessment:  CSW received consult for THC  use.  CSW met with MOB to offer support and complete assessment.    MOB resting in bed with FOB present at bedside and holding infant, when CSW entered the room. CSW introduced self and received verbal permission from MOB to have FOB step out while CSW completed assessment. FOB understanding and left to go run errands. FOB very attentive and appropriate with infant prior to him leaving. CSW explained reason for consult to which MOB expressed understanding. MOB very pleasant, polite and engaged throughout assessment. MOB also very attentive and appropriate with infant during visit. Per MOB, she and FOB currently live with FOB's aunt. MOB stated her 68-monthold son also lives with them. MOB reported she currently works part-time at [employer]and confirmed she receives both [employer] CSW inquired about MOB's mental health history and MOB acknowledged having some anxiety and depression in middle school surrounding her parents divorce. Per MOB, she was in counseling for 4 years but denied any symptoms or concerns since. MOB denied any PPD with her first child but was receptive to education. CSW provided education regarding the baby blues period vs. perinatal mood disorders, discussed treatment and gave resources for mental health follow up  if concerns arise.  CSW recommends self-evaluation during the postpartum time period using the New Mom Checklist from Postpartum Progress and encouraged MOB to contact a medical professional if symptoms are noted at any time. MOB denied any current SI, HI or DV and reported having good support from FOB, FOB's aunt, FOB's cousin and her mother.  CSW inquired about MOB's substance use history and MOB acknowledged using marijuana during pregnancy to help with some of her worries and pregnancy symptoms. MOB reported that her last use was May 29, 2019. CSW informed MOB of Hospital Drug Policy and explained UDS came back positive for [facility] and that CDS would continue  to be monitored. CSW explained that due to infant's UDS being positive for [facility] that CSW would have to make a [facility] CPS report. MOB denied any prior CPS involvement and denied any questions or concerns once CSW explained what process could look like.   MOB confirmed having all essential items for infant once discharged and verified infant had a safe place to sleep once home. CSW provided review of Sudden Infant Death Syndrome (SIDS) precautions and safe sleeping habits.   CSW made [facility] CPS report due to infant's positive UDS for THC. No barriers to discharge, at this time. CPS to follow up with MOB within 72 hours.    CSW Plan/Description:  No Further Intervention Required/No Barriers to Discharge, Sudden Infant Death Syndrome (SIDS) Education, Perinatal Mood and Anxiety Disorder (PMADs) Education, [place], Child Protective Service Report , CSW Will Continue to Monitor Umbilical Cord Tissue Drug Screen Results and Make Report if Foye Spurling, [place] 06/05/2019, 2:06 PM

## 2019-06-05 NOTE — Lactation Note (Signed)
This note was copied from a baby's chart. Lactation Consultation Note  Patient Name: Boy Jaquelyn Sakamoto XENMM'H Date: 06/05/2019 Reason for consult: Follow-up assessment Baby is 33 hours old/3% weight loss.  Mom reports that baby is latching and feeding well.  She stated this is her second baby and she has breastfed before.  Baby is currently showing early feeding cues.  Mom states he couldn't be hungry already because he just fed 30 minutes ago.  Reviewed cluster feeding and encouraged to feed with any cue.  Instructed to call with concerns prn.  Maternal Data    Feeding    LATCH Score                   Interventions    Lactation Tools Discussed/Used     Consult Status Consult Status: Follow-up Date: 06/06/19 Follow-up type: In-patient    Ave Filter 06/05/2019, 2:13 PM

## 2019-06-05 NOTE — Progress Notes (Addendum)
Subjective: Postpartum Day 1: Cesarean Delivery Patient reports that she had been having pain throughout the night but took Tylenol and Oxycodone a couple hours ago and currently pain is well-controlled. Minimal lochia. Tolerating a regular diet and ambulating. She has voided and passing flatus. She would like to DC 9/8 if she can. Breastfeeding without difficulties. Having some discomfort at the back of her throat. Denies trouble swallowing or breathing.   Objective: Vital signs in last 24 hours: Temp:  [97.8 F (36.6 C)-98.1 F (36.7 C)] 98 F (36.7 C) (09/07 0541) Pulse Rate:  [53-71] 60 (09/07 0541) Resp:  [10-18] 18 (09/07 0541) BP: (104-116)/(54-76) 116/54 (09/07 0541) SpO2:  [99 %-100 %] 100 % (09/07 0541)  Physical Exam:  General: alert, cooperative, appears stated age and no distress  Oropharynx: Clear, non-erythematous, no inflammation noted Lochia: appropriate Uterine Fundus: firm Incision: healing well, no significant drainage, no dehiscence, no significant erythema DVT Evaluation: No evidence of DVT seen on physical exam.  Recent Labs    06/03/19 1850 06/05/19 0453  HGB 10.3* 8.4*  HCT 34.6* 27.2*    Assessment/Plan: Status post Cesarean section. Doing well postoperatively.  Continue current care. Discussed birth control options. Patient would like to use patch. Discussed that estrogen is not recommended for breastfeeding or in first 6 weeks postpartum. Patient agreeable to wait until 6 week postpartum visit to start this and does not plan to be sexually active in this time. Had IUD previously and like this; could discuss placement at HD. Patient to schedule appt with HD prior to DC.  Post-op Hgb 8.4; ferrous sulfate ordered. Patient asymptomatic.  Irritation of throat likely due to post-intubation. Supportive care.  Plan for DC 9/8.  [place] 06/05/2019, 6:35 AM

## 2019-06-06 NOTE — Progress Notes (Signed)
Post Partum Day 2 for RCS  Subjective: Pt doing well. Pain at c-section scar which improves with tylenol, ibuprofen and oxy. Breast and bottle feeding baby. Lochia: feels like a "super heavy period". Passed 3 X coin sized clots. Had a conversation with Dr Marice Potter yesterday regarding contraceptive patch who explained would not be able to start until 6 weeks post delivery due to increased risk of thromboembolism. Pt was happy with this plan and was not planning to have intercourse for this time anyway. Mobilizing, tolerating PO, voiding well. Denies headache, RUQ pain, chest pain, SOB, cough or fevers. Would like to stay another day.   Objective: Blood pressure (!) 98/54, pulse 61, temperature 97.8 F (36.6 C), temperature source Oral, resp. rate 19, height 5\' 1"  (1.549 m), weight 70.8 kg, last menstrual period 08/20/2018, SpO2 99 %.  Physical Exam:  General: alert, cooperative and appears stated age  Cardiac: RRR, no rubs or gallops Pulm: chest clear on ausc, no crackles or wheeze, no resp distress GI: abdo soft, mild lower abdominal tenderness, bowel sounds present Lochia: appropriate Uterine Fundus: firm Incision: no significant drainage, no significant erythema, dressing dry and clean  DVT Evaluation: No cords or calf tenderness. No significant calf/ankle edema.  Recent Labs    06/03/19 1850 06/05/19 0453  HGB 10.3* 8.4*  HCT 34.6* 27.2*    Assessment/Plan: S/p c-section. Doing well postoperatively Pt to schedule appt with HD prior to d/c to discuss option of IUD  Plan for discharge tomorrow   LOS: 3 days   Lattie Haw MD 06/06/2019, 8:48 AM

## 2019-06-07 ENCOUNTER — Encounter (HOSPITAL_COMMUNITY): Payer: Self-pay | Admitting: *Deleted

## 2019-06-07 MED ORDER — FERROUS SULFATE 325 (65 FE) MG PO TABS: 325.0000 mg | ORAL_TABLET | Freq: Every day | ORAL | 3 refills | Status: DC

## 2019-06-07 MED ORDER — DOCUSATE SODIUM 100 MG PO CAPS: 100.0000 mg | ORAL_CAPSULE | Freq: Two times a day (BID) | ORAL | 2 refills | Status: AC

## 2019-06-07 MED ORDER — OXYCODONE HCL 5 MG PO TABA: 5.0000 mg | ORAL_TABLET | Freq: Four times a day (QID) | ORAL | 0 refills | Status: AC | PRN

## 2019-06-07 MED ORDER — FERROUS SULFATE 325 (65 FE) MG PO TABS: 325.0000 mg | ORAL_TABLET | Freq: Two times a day (BID) | ORAL | 0 refills | Status: DC

## 2019-06-07 MED ORDER — DOCUSATE SODIUM 100 MG PO CAPS: 100.0000 mg | ORAL_CAPSULE | Freq: Two times a day (BID) | ORAL | 0 refills | Status: DC

## 2019-06-07 MED ORDER — OXYCODONE HCL 5 MG PO TABS: 5.0000 mg | ORAL_TABLET | Freq: Four times a day (QID) | ORAL | 0 refills | Status: DC | PRN

## 2019-06-07 MED ORDER — IBUPROFEN 800 MG PO TABS: 800.0000 mg | ORAL_TABLET | Freq: Three times a day (TID) | ORAL | 0 refills | Status: DC | PRN

## 2019-06-07 MED FILL — DOK 100 MG CAPS: 100 | 30 days supply | Qty: 60 | Fill #0

## 2019-06-07 MED FILL — oxyCODONE HCL 5 MG TABS: 5 | 5 days supply | Qty: 20 | Fill #0

## 2019-06-07 MED FILL — FERROUS SULFATE 325 MG TAB: 325 (65 FE) | 30 days supply | Qty: 30 | Fill #0

## 2019-06-07 NOTE — Discharge Instructions (Signed)

## 2019-06-07 NOTE — Progress Notes (Addendum)
Subjective: Postpartum Day 3: Cesarean Delivery  Patient continues to do well this morning. She complains of left leg cramping and a productive cough w/clear sputum overnight. Continues to endorse some pain at her C-section incision site that is responsive to tylenol, ibuprofen and oxycodone. Pt would like to go home on oxycodone. Her lochia has lightened since yesterday; however, she noticed a 3-4 cm palm sized clot while using the restroom yesterday. She is currently breast and bottle feeding. In regards to contraception, she has decided to use a contraceptive patch 6 weeks post delivery.  She is tolerating PO, ambulating well, sitting upright in bed when she's not sleeping and voiding without issues. Still no bowel movements despite use of senna-docusate since 09/07. She denies headache, vision changes, SOB, chest pain, abdominal pain, and abdominal cramps.    Patient is comfortable being discharged today but is worrisome that solely ibuprofen and tylenol will be enough to control her pain while at home.   Objective: Vital signs in last 24 hours: Temp:  [98 F (36.7 C)-98.5 F (36.9 C)] 98 F (36.7 C) (09/09 0515) Pulse Rate:  [67-98] 67 (09/09 0515) Resp:  [16-20] 20 (09/09 0515) BP: (111-124)/(70-83) 111/73 (09/09 0515) SpO2:  [100 %] 100 % (09/09 0515)  Physical Exam:  General: alert, cooperative, appears stated age and no distress  Pulm: CTAB, no wheezes or crackles Cardiac: RRR, Normal S1, S2, no M/R/G Abd: Soft abdomen, tenderness to deep palpation, NABS Lochia: appropriate Uterine Fundus: firm Incision: healing well, no significant drainage seen through honeycomb dressing DVT Evaluation: No evidence of DVT seen on physical exam. No significant calf/ankle edema.  Recent Labs    06/05/19 0453  HGB 8.4*  HCT 27.2*    Assessment/Plan: Status post Cesarean section. Doing well postoperatively.  - Discharge home with standard precautions and return to clinic in 4-6 weeks. -  Contraceptive patch 6 weeks post delivery    Lattie Haw MD 06/07/2019, 11:11 AM   I have seen and evaluated the patient with the NP/PA/Med student. I agree with the assessment and plan as written above. See provider note for full documentation See discharge summary.    Jorje Guild, NP  06/07/2019 11:28 AM

## 2019-06-07 NOTE — Care Management (Signed)
Received call from Blanca that patient does not have insurance yet and not sure if she can afford her medications.  CM called Olivia Mackie at  [place] at [facility] and cost for Oxycodone, colace and iron was $11.00.  Patient able to afford that and will obtain medicines here at hospital prior to discharge. CM called [facility] representative at [facility] on [facility]. and had her cancel that prescriptions that had been sent there earlier. No other needs at this time.

## 2019-06-07 NOTE — Lactation Note (Signed)
This note was copied from a baby's chart. Lactation Consultation Note  Patient Name: Darlene Reynolds [id number] Date: 06/07/2019 Reason for consult: Follow-up assessment;Term Baby is 77 hours old/3% weight loss.  Mom's milk is in.  Breasts are very full but not engorged.  Instructed on prevention and treatment of engorgement.  She has a manual pump for prn use.  Baby is currently on the breast in football hold actively sucking.  Mom using breast massage.  No questions or concerns.  Reviewed lactation services and encouraged to call prn.  Maternal Data    Feeding Feeding Type: Breast Fed  LATCH Score Latch: Grasps breast easily, tongue down, lips flanged, rhythmical sucking.  Audible Swallowing: Spontaneous and intermittent  Type of Nipple: Everted at rest and after stimulation  Comfort (Breast/Nipple): Soft / non-tender  Hold (Positioning): No assistance needed to correctly position infant at breast.  LATCH Score: 10  Interventions    Lactation Tools Discussed/Used     Consult Status Consult Status: Complete Follow-up type: Call as needed    Ave Filter 06/07/2019, 10:15 AM

## 2019-06-07 NOTE — Plan of Care (Signed)
  Problem: Activity: Goal: Will verbalize the importance of balancing activity with adequate rest periods 06/07/2019 0133 by Ivy Lynn, RN Outcome: Completed/Met 06/07/2019 0133 by Ivy Lynn, RN Outcome: Progressing   Problem: Activity: Goal: Will verbalize the importance of balancing activity with adequate rest periods 06/07/2019 0133 by Ivy Lynn, RN Outcome: Completed/Met 06/07/2019 0133 by Merlyn Lot A, RN Outcome: Progressing Goal: Ability to tolerate increased activity will improve 06/07/2019 0133 by Ivy Lynn, RN Outcome: Completed/Met 06/07/2019 0133 by Merlyn Lot A, RN Outcome: Progressing   Problem: Coping: Goal: Ability to identify and utilize available resources and services will improve 06/07/2019 0133 by Ivy Lynn, RN Outcome: Completed/Met 06/07/2019 0133 by Merlyn Lot A, RN Outcome: Progressing   Problem: Life Cycle: Goal: Chance of risk for complications during the postpartum period will decrease 06/07/2019 0133 by Ivy Lynn, RN Outcome: Completed/Met 06/07/2019 0133 by Merlyn Lot A, RN Outcome: Progressing   Problem: Role Relationship: Goal: Ability to demonstrate positive interaction with newborn will improve 06/07/2019 0133 by Ivy Lynn, RN Outcome: Completed/Met 06/07/2019 0133 by Merlyn Lot A, RN Outcome: Progressing   Problem: Skin Integrity: Goal: Demonstration of wound healing without infection will improve 06/07/2019 0133 by Ivy Lynn, RN Outcome: Completed/Met

## 2019-06-21 ENCOUNTER — Ambulatory Visit: Payer: Self-pay

## 2019-07-04 ENCOUNTER — Ambulatory Visit: Payer: Self-pay | Admitting: Family Medicine

## 2019-12-28 ENCOUNTER — Emergency Department (HOSPITAL_COMMUNITY)
Admission: EM | Admit: 2019-12-28 | Discharge: 2019-12-28 | Disposition: A | Discharge status: Home / Self Care | Payer: Medicaid Other | Attending: Emergency Medicine | Admitting: Emergency Medicine

## 2019-12-28 ENCOUNTER — Emergency Department (HOSPITAL_COMMUNITY): Payer: Medicaid Other

## 2019-12-28 ENCOUNTER — Other Ambulatory Visit: Payer: Self-pay

## 2019-12-28 DIAGNOSIS — R1031 Right lower quadrant pain: Secondary | ICD-10-CM | POA: Diagnosis not present

## 2019-12-28 DIAGNOSIS — Z3A01 Less than 8 weeks gestation of pregnancy: Secondary | ICD-10-CM | POA: Diagnosis not present

## 2019-12-28 DIAGNOSIS — O2301 Infections of kidney in pregnancy, first trimester: Secondary | ICD-10-CM | POA: Insufficient documentation

## 2019-12-28 DIAGNOSIS — N12 Tubulo-interstitial nephritis, not specified as acute or chronic: Secondary | ICD-10-CM | POA: Diagnosis not present

## 2019-12-28 DIAGNOSIS — O99891 Other specified diseases and conditions complicating pregnancy: Secondary | ICD-10-CM | POA: Diagnosis not present

## 2019-12-28 LAB — CBC WITH DIFFERENTIAL/PLATELET
Abs Immature Granulocytes: 0.03 10*3/uL (ref 0.00–0.07)
Basophils Absolute: 0 10*3/uL (ref 0.0–0.1)
Basophils Relative: 0 %
Eosinophils Absolute: 0 10*3/uL (ref 0.0–0.5)
Eosinophils Relative: 0 %
HCT: 41.7 % (ref 36.0–46.0)
Hemoglobin: 13.1 g/dL (ref 12.0–15.0)
Immature Granulocytes: 0 %
Lymphocytes Relative: 17 %
Lymphs Abs: 1.5 10*3/uL (ref 0.7–4.0)
MCH: 26.2 pg (ref 26.0–34.0)
MCHC: 31.4 g/dL (ref 30.0–36.0)
MCV: 83.4 fL (ref 80.0–100.0)
Monocytes Absolute: 0.8 10*3/uL (ref 0.1–1.0)
Monocytes Relative: 10 %
Neutro Abs: 6.3 10*3/uL (ref 1.7–7.7)
Neutrophils Relative %: 73 %
Platelets: 242 10*3/uL (ref 150–400)
RBC: 5 MIL/uL (ref 3.87–5.11)
RDW: 14.3 % (ref 11.5–15.5)
WBC: 8.7 10*3/uL (ref 4.0–10.5)
nRBC: 0 % (ref 0.0–0.2)

## 2019-12-28 LAB — COMPREHENSIVE METABOLIC PANEL
ALT: 11 U/L (ref 0–44)
AST: 14 U/L — ABNORMAL LOW (ref 15–41)
Albumin: 3.8 g/dL (ref 3.5–5.0)
Alkaline Phosphatase: 51 U/L (ref 38–126)
Anion gap: 11 (ref 5–15)
BUN: <5 mg/dL — ABNORMAL LOW (ref 6–20)
CO2: 23 mmol/L (ref 22–32)
Calcium: 8.7 mg/dL — ABNORMAL LOW (ref 8.9–10.3)
Chloride: 104 mmol/L (ref 98–111)
Creatinine, Ser: 0.67 mg/dL (ref 0.44–1.00)
GFR calc Af Amer: >60 mL/min (ref >60)
GFR calc non Af Amer: >60 mL/min (ref >60)
Glucose, Bld: 105 mg/dL — ABNORMAL HIGH (ref 70–99)
Potassium: 3.5 mmol/L (ref 3.5–5.1)
Sodium: 138 mmol/L (ref 135–145)
Total Bilirubin: 0.9 mg/dL (ref 0.3–1.2)
Total Protein: 6.7 g/dL (ref 6.5–8.1)

## 2019-12-28 LAB — URINALYSIS, ROUTINE W REFLEX MICROSCOPIC
Bilirubin Urine: NEGATIVE
Glucose, UA: NEGATIVE mg/dL
Ketones, ur: NEGATIVE mg/dL
Nitrite: POSITIVE — AB
Protein, ur: 100 mg/dL — AB
Specific Gravity, Urine: 1.011 (ref 1.005–1.030)
WBC, UA: >50 WBC/hpf — ABNORMAL HIGH (ref 0–5)
pH: 6 (ref 5.0–8.0)

## 2019-12-28 LAB — HCG, QUANTITATIVE, PREGNANCY: hCG, Beta Chain, Quant, S: 87136 m[IU]/mL — ABNORMAL HIGH (ref <5)

## 2019-12-28 LAB — LIPASE, BLOOD: Lipase: 26 U/L (ref 11–51)

## 2019-12-28 MED ORDER — CEPHALEXIN 500 MG PO CAPS: 500.0000 mg | ORAL_CAPSULE | Freq: Three times a day (TID) | ORAL | 0 refills | Status: AC

## 2019-12-28 NOTE — Discharge Instructions (Signed)
I have prescribed antibiotics to help treat your infection, please take 1 tablet 3 times a day for the next 10 days.  If you experience any fever, worsening pain, vomiting please return to the emergency department.

## 2019-12-28 NOTE — ED Notes (Signed)
Pt returned from US at this time. Placed pt back on monitor, pt placed in position of comfort and advised of wait status.   

## 2019-12-28 NOTE — ED Provider Notes (Signed)
[facility] EMERGENCY DEPARTMENT Provider Note   CSN: [id number] Arrival date & time: 12/28/19  0757     History No chief complaint on file.   Darlene Reynolds is a 24 y.o. female.  24 y.o female with no PMH presents to the ED with a chief complaint of right flank pain x 1 week. Patient reports a sharp sensation to the right flank with radiation to the right upper quadrant, also reports difficulty urinating, reports no dysuria but states that she feels pain prior to voiding.  She also reports generalized abdominal cramping.  According to patient, she did take a pregnancy test 1 week ago, she was positive.  Her last menstrual period was February 7 of 2021.  She endorses daily nausea, has not had any vomiting.  Has had some episodes of diarrhea.  No vaginal bleeding, fever, vomiting or shortness of breath.  Of note, patient does have a prior surgical history of 2 C-sections.    The history is provided by medical records and the patient.       Past Medical History:  Diagnosis Date  . Medical history non-contributory     Patient Active Problem List   Diagnosis Date Noted  . Encounter for planned induction of labor 06/03/2019    Past Surgical History:  Procedure Laterality Date  . CESAREAN SECTION    . CESAREAN SECTION N/A 06/04/2019   Procedure: CESAREAN SECTION;  Surgeon: Lakeview Bing, MD;  Location: [facility] LD ORS;  Service: Obstetrics;  Laterality: N/A;     OB History    Gravida  2   Para  1   Term  1   Preterm      AB      Living  1     SAB      TAB      Ectopic      Multiple      Live Births              No family history on file.  Social History   Tobacco Use  . Smoking status: Current Every Day Smoker    Packs/day: 0.25  . Smokeless tobacco: Never Used  Substance Use Topics  . Alcohol use: Never  . Drug use: Never    Home Medications Prior to Admission medications   Medication Sig Start Date End Date Taking?  Authorizing Provider  acetaminophen (TYLENOL) 325 MG tablet Take 650 mg by mouth as needed (pain).    Yes [provider]  calcium carbonate (TUMS - DOSED IN MG ELEMENTAL CALCIUM) 500 MG chewable tablet Chew 1-2 tablets by mouth as needed for indigestion or heartburn.    Yes [provider]  phenazopyridine (PYRIDIUM) 95 MG tablet Take 95 mg by mouth 3 (three) times daily as needed for pain.   Yes [provider]  cephALEXin (KEFLEX) 500 MG capsule Take 1 capsule (500 mg total) by mouth 3 (three) times daily for 10 days. 12/28/19 01/07/20  Claude Manges, PA-C  ferrous sulfate 325 (65 FE) MG tablet Take 1 tablet (325 mg total) by mouth daily with breakfast. Patient not taking: Reported on 12/28/2019 06/08/19   Towanda Octave, MD    Allergies    Sulfa antibiotics and Nickel  Review of Systems   Review of Systems  Constitutional: Positive for chills. Negative for fever.  HENT: Negative for sore throat.   Respiratory: Negative for shortness of breath.   Cardiovascular: Negative for chest pain.  Gastrointestinal: Positive for abdominal pain and  nausea. Negative for vomiting.  Genitourinary: Positive for flank pain (right).  Musculoskeletal: Negative for back pain.  Neurological: Negative for light-headedness and headaches.  All other systems reviewed and are negative.   Physical Exam Updated Vital Signs BP 108/75 (BP Location: Right Arm)   Pulse 75   Temp 98.3 F (36.8 C) (Oral)   Resp 17   Ht 5\' 1"  (1.549 m)   Wt 63.5 kg   SpO2 100%   BMI 26.45 kg/m   Physical Exam Vitals and nursing note reviewed.  Constitutional:      General: She is not in acute distress.    Appearance: She is well-developed.  HENT:     Head: Normocephalic and atraumatic.     Mouth/Throat:     Pharynx: No oropharyngeal exudate.  Eyes:     Pupils: Pupils are equal, round, and reactive to light.  Cardiovascular:     Rate and Rhythm: Regular rhythm.     Heart sounds: Normal heart  sounds.  Pulmonary:     Effort: Pulmonary effort is normal. No respiratory distress.     Breath sounds: Normal breath sounds.  Abdominal:     General: Bowel sounds are normal. There is no distension.     Palpations: Abdomen is soft.     Tenderness: There is generalized abdominal tenderness and tenderness in the right upper quadrant. There is right CVA tenderness.  Musculoskeletal:        General: No tenderness or deformity.     Cervical back: Normal range of motion.     Right lower leg: No edema.     Left lower leg: No edema.  Skin:    General: Skin is warm and dry.  Neurological:     Mental Status: She is alert and oriented to person, place, and time.     ED Results / Procedures / Treatments   Labs (all labs ordered are listed, but only abnormal results are displayed) Labs Reviewed  COMPREHENSIVE METABOLIC PANEL - Abnormal; Notable for the following components:      Result Value   Glucose, Bld 105 (*)    BUN <5 (*)    Calcium 8.7 (*)    AST 14 (*)    All other components within normal limits  URINALYSIS, ROUTINE W REFLEX MICROSCOPIC - Abnormal; Notable for the following components:   Color, Urine AMBER (*)    APPearance HAZY (*)    Hgb urine dipstick SMALL (*)    Protein, ur 100 (*)    Nitrite POSITIVE (*)    Leukocytes,Ua MODERATE (*)    WBC, UA >50 (*)    Bacteria, UA FEW (*)    All other components within normal limits  URINE CULTURE  CBC WITH DIFFERENTIAL/PLATELET  LIPASE, BLOOD  HCG, QUANTITATIVE, PREGNANCY    EKG None  Radiology Renal  Result Date: 12/28/2019 CLINICAL DATA:  Right flank pain, pregnant EXAM: RENAL / URINARY TRACT ULTRASOUND COMPLETE COMPARISON:  None. FINDINGS: Right Kidney: Renal measurements: 12 x 4.4 x 5.8 cm = volume: 158 mL . Echogenicity within normal limits. No mass or hydronephrosis visualized. Left Kidney: Renal measurements: 10.2 x 4.3 x 3.9 cm = volume: 89.9 mL. Echogenicity within normal limits. No mass or hydronephrosis  visualized. Bladder: Appears normal for degree of bladder distention. Other: None. IMPRESSION: Normal renal ultrasound. Electronically Signed   By: 02/27/2020 M.D.   On: 12/28/2019 10:29    Procedures Procedures (including critical care time)  Medications Ordered in ED Medications - No  data to display  ED Course  I have reviewed the triage vital signs and the nursing notes.  Pertinent labs & imaging results that were available during my care of the patient were reviewed by me and considered in my medical decision making (see chart for details).    MDM Rules/Calculators/A&P     Presents to the ED with complaints of right flank pain which began a week ago.  Reports this pain is sharp and radiates right to her right upper quadrant.  She does report some difficulty urinating, not particularly dysuria but does report is difficult for her to begin to void.  Some generalized abdominal pain along with nausea.  She also reports taking a pregnancy test a couple days ago which was positive.  States she "I do not plan on keeping this pregnancy ".  Patient does currently have a 51-month-old at home. He has been taking Tylenol for her symptoms with some improvement.  Prior surgical history aside from 2 C-sections.  Interpretation of her labs with a CBC without any leukocytosis, she is afebrile on today's visit and nontoxic-appearing.  CMP with a normal creatinine level, lower suspicion for distracting liver lithiasis.  LFTs are unremarkable.  No electrolyte derangement.  Urinalysis is positive for nitrites, leukocytes, greater than 50 white blood cell count.  Differential diagnoses included but not limited to pyelonephritis versus infected stone.  Ultrasound renal was obtained which showed: No obstruction, kidney stone.  I discussed these results with patient.  According to her last menstrual period on February 7 she is currently: A routine quantitative hCG has been obtained.  Gestational age [redacted] weeks &  4 days  Results of lab work and imaging were discussed with patient at length.  We are currently waiting on her hCG quantitative, patient does not want to know results at this time.  She will be treated for pyelonephritis today, she is to go home on Keflex 3 times daily, proper management was discussed with pharmacist on-call Juliann Pulse.  Patient understands and agrees with management, return precautions provided at length.   Portions of this note were generated with L[employer]. Dictation errors may occur despite best attempts at proofreading.  Final Clinical Impression(s) / ED Diagnoses Final diagnoses:  Pyelonephritis  Less than [redacted] weeks gestation of pregnancy    Rx / DC Orders ED Discharge Orders         Ordered    cephALEXin (KEFLEX) 500 MG capsule  3 times daily     12/28/19 1148           Janeece Fitting, PA-C 12/28/19 1156    Sherwood Gambler, MD 12/29/19 [phone]

## 2019-12-28 NOTE — ED Notes (Signed)
Pt transported to US via stretcher at this time.  

## 2019-12-28 NOTE — ED Notes (Signed)
Called lab in regards to urine orders that were added to pt's chart. Lab advised me to print the requisitions, send them to the lab and the specimens will be tested. I printed the information and sent it to the lab at this time.

## 2019-12-30 LAB — URINE CULTURE: Culture: >=100000 — AB

## 2020-02-17 ENCOUNTER — Other Ambulatory Visit: Payer: Self-pay

## 2020-02-17 ENCOUNTER — Encounter (HOSPITAL_COMMUNITY): Payer: Self-pay | Admitting: Emergency Medicine

## 2020-02-17 ENCOUNTER — Emergency Department (HOSPITAL_COMMUNITY)
Admission: EM | Admit: 2020-02-17 | Discharge: 2020-02-17 | Disposition: A | Discharge status: Home / Self Care | Payer: Medicaid Other | Attending: Emergency Medicine | Admitting: Emergency Medicine

## 2020-02-17 DIAGNOSIS — K047 Periapical abscess without sinus: Secondary | ICD-10-CM | POA: Diagnosis not present

## 2020-02-17 DIAGNOSIS — K0889 Other specified disorders of teeth and supporting structures: Secondary | ICD-10-CM | POA: Diagnosis present

## 2020-02-17 DIAGNOSIS — Z79899 Other long term (current) drug therapy: Secondary | ICD-10-CM | POA: Insufficient documentation

## 2020-02-17 DIAGNOSIS — F17210 Nicotine dependence, cigarettes, uncomplicated: Secondary | ICD-10-CM | POA: Insufficient documentation

## 2020-02-17 MED ORDER — MELOXICAM 7.5 MG PO TABS: 7.5000 mg | ORAL_TABLET | Freq: Every day | ORAL | 0 refills | Status: AC

## 2020-02-17 MED ORDER — CLINDAMYCIN HCL 300 MG PO CAPS: 300.0000 mg | ORAL_CAPSULE | Freq: Three times a day (TID) | ORAL | 0 refills | Status: AC

## 2020-02-17 NOTE — ED Triage Notes (Signed)
C/o L upper toothache x 1 month.  Denies swelling or fever.  Reports chills.

## 2020-02-17 NOTE — ED Provider Notes (Signed)
[facility] EMERGENCY DEPARTMENT Provider Note   CSN: [id number] Arrival date & time: 02/17/20  1102     History Chief Complaint  Patient presents with  . Dental Pain    Darlene Reynolds is a 24 y.o. female.  24 year old female presents with complaint of left upper dental pain.  Patient states that her wisdom tooth in this area has been coming in for a while and applying pressure to her next molar, states a few days ago part of her molar broke resulting in increase in pain in the area.  Denies trauma, fever, drainage.  No other complaints or concerns.        Past Medical History:  Diagnosis Date  . Medical history non-contributory     Patient Active Problem List   Diagnosis Date Noted  . Encounter for planned induction of labor 06/03/2019    Past Surgical History:  Procedure Laterality Date  . CESAREAN SECTION    . CESAREAN SECTION N/A 06/04/2019   Procedure: CESAREAN SECTION;  Surgeon: Aletha Halim, MD;  Location: [facility] LD ORS;  Service: Obstetrics;  Laterality: N/A;     OB History    Gravida  2   Para  1   Term  1   Preterm      AB      Living  1     SAB      TAB      Ectopic      Multiple      Live Births              No family history on file.  Social History   Tobacco Use  . Smoking status: Current Every Day Smoker    Packs/day: 0.25  . Smokeless tobacco: Never Used  Substance Use Topics  . Alcohol use: Never  . Drug use: Never    Home Medications Prior to Admission medications   Medication Sig Start Date End Date Taking? Authorizing Provider  acetaminophen (TYLENOL) 325 MG tablet Take 650 mg by mouth as needed (pain).     [provider]  calcium carbonate (TUMS - DOSED IN MG ELEMENTAL CALCIUM) 500 MG chewable tablet Chew 1-2 tablets by mouth as needed for indigestion or heartburn.     [provider]  clindamycin (CLEOCIN) 300 MG capsule Take 1 capsule (300 mg total) by mouth 3 (three) times  daily for 7 days. 02/17/20 02/24/20  Tacy Learn, PA-C  ferrous sulfate 325 (65 FE) MG tablet Take 1 tablet (325 mg total) by mouth daily with breakfast. Patient not taking: Reported on 12/28/2019 06/08/19   Lattie Haw, MD  meloxicam (MOBIC) 7.5 MG tablet Take 1 tablet (7.5 mg total) by mouth daily for 10 days. 02/17/20 02/27/20  Tacy Learn, PA-C  phenazopyridine (PYRIDIUM) 95 MG tablet Take 95 mg by mouth 3 (three) times daily as needed for pain.    [provider]    Allergies    Sulfa antibiotics and Nickel  Review of Systems   Review of Systems  Constitutional: Negative for chills and fever.  HENT: Positive for dental problem. Negative for trouble swallowing and voice change.   Gastrointestinal: Negative for vomiting.  Musculoskeletal: Negative for neck pain and neck stiffness.  Skin: Negative for rash and wound.  Allergic/Immunologic: Negative for immunocompromised state.  Neurological: Negative for headaches.  Hematological: Negative for adenopathy.  Psychiatric/Behavioral: Negative for confusion.  All other systems reviewed and are negative.   Physical Exam Updated Vital Signs BP  122/81 (BP Location: Right Arm)   Pulse 63   Temp 98.9 F (37.2 C) (Oral)   Resp 12   Ht 5\' 1"  (1.549 m)   Wt 63.5 kg   LMP 01/29/2020   SpO2 99%   BMI 26.45 kg/m   Physical Exam Vitals and nursing note reviewed.  Constitutional:      General: She is not in acute distress.    Appearance: She is well-developed. She is not diaphoretic.  HENT:     Head: Normocephalic and atraumatic.     Jaw: No trismus.     Mouth/Throat:     Dentition: Dental tenderness and dental caries present. No gingival swelling or dental abscesses.   Pulmonary:     Effort: Pulmonary effort is normal.  Neurological:     Mental Status: She is alert and oriented to person, place, and time.  Psychiatric:        Behavior: Behavior normal.     ED Results / Procedures / Treatments   Labs (all labs  ordered are listed, but only abnormal results are displayed) Labs Reviewed - No data to display  EKG None  Radiology No results found.  Procedures Procedures (including critical care time)  Medications Ordered in ED Medications - No data to display  ED Course  I have reviewed the triage vital signs and the nursing notes.  Pertinent labs & imaging results that were available during my care of the patient were reviewed by me and considered in my medical decision making (see chart for details).  Clinical Course as of Feb 16 1221  Sat Feb 17, 2020  2161 24 year old female with left upper dental pain.  No obvious abscess however does have large cavity versus fracture of the tooth in the area with surrounding tenderness.  Suspect developing abscess, will treat with clindamycin, given meloxicam for pain as well as LolliCaine while in the ER.  Referred to oral surgery on call and given dental referral list.   [LM]    Clinical Course User Index [LM] 25   MDM Rules/Calculators/A&P                      Final Clinical Impression(s) / ED Diagnoses Final diagnoses:  Dental abscess    Rx / DC Orders ED Discharge Orders         Ordered    clindamycin (CLEOCIN) 300 MG capsule  3 times daily     02/17/20 1214    meloxicam (MOBIC) 7.5 MG tablet  Daily     02/17/20 1214           02/19/20 02/17/20 1222    02/19/20, MD 02/17/20 1534

## 2020-02-17 NOTE — Discharge Instructions (Signed)
Take clindamycin as prescribed and complete the full course.  Take meloxicam as needed as prescribed for pain.  You can also apply the gel as described. Rinse with Listerine after every meal. Contact your dentist on Monday for follow-up, see referral and resource list.

## 2020-07-01 ENCOUNTER — Ambulatory Visit (INDEPENDENT_AMBULATORY_CARE_PROVIDER_SITE_OTHER): Payer: Medicaid Other | Admitting: *Deleted

## 2020-07-01 ENCOUNTER — Other Ambulatory Visit: Payer: Self-pay

## 2020-07-01 VITALS — BP 106/68 | HR 80 | Ht 61.0 in | Wt 126.0 lb

## 2020-07-01 DIAGNOSIS — Z3687 Encounter for antenatal screening for uncertain dates: Secondary | ICD-10-CM

## 2020-07-01 DIAGNOSIS — Z3200 Encounter for pregnancy test, result unknown: Secondary | ICD-10-CM

## 2020-07-01 LAB — POCT PREGNANCY, URINE: Preg Test, Ur: POSITIVE — AB

## 2020-07-01 NOTE — Progress Notes (Signed)
Here for pregnancy test which was positive. States LMP 02/23/20- was a normal period except lasted 4-5 days, usually 5-7. But since then has light  spotting when she is doing things like lifting laundry, etc. States had spotted about 4 times.  States had baby last 05/2019. Breastfed until 09/27/19 or so.  States had regualr  periods since January . Discussed with patient per protocol if bleeding is not like her normal period will order dating [place] to determine EDD.  Advised to take prenatal vitamins. Advised to start prenatal care with provider of her choice. Explained she can call us regarding EDD after her [place].  List of prenatal providers placed in AVS today.  Yulianna Folse,RN

## 2020-07-01 NOTE — Patient Instructions (Signed)
Prenatal Care Providers           [facility]  [address] [phone]  [facility]   [address]  [phone]  [facility]       [address] [phone]            [facility] @ [place]     [address] [phone]          [facility] [place]   [address] [phone]  [facility]  [address] [phone]     [facility]  [address]   [phone]     [facility]  Phone: [phone]  [facility]  Phone: [phone]  [facility] Phone: [phone]  [facility] Phone: [phone]  [facility] Phone: [phone]  [facility] Phone: [phone]  [facility]  Phone: [phone]  

## 2020-07-02 ENCOUNTER — Inpatient Hospital Stay (HOSPITAL_BASED_OUTPATIENT_CLINIC_OR_DEPARTMENT_OTHER): Payer: Medicaid Other

## 2020-07-02 ENCOUNTER — Encounter (HOSPITAL_COMMUNITY): Payer: Self-pay | Admitting: Obstetrics and Gynecology

## 2020-07-02 ENCOUNTER — Other Ambulatory Visit: Payer: Self-pay

## 2020-07-02 ENCOUNTER — Inpatient Hospital Stay (HOSPITAL_COMMUNITY)
Admission: AD | Admit: 2020-07-02 | Discharge: 2020-07-02 | Disposition: A | Discharge status: Home / Self Care | Payer: Medicaid Other | Attending: Obstetrics and Gynecology | Admitting: Obstetrics and Gynecology

## 2020-07-02 DIAGNOSIS — Z3A18 18 weeks gestation of pregnancy: Secondary | ICD-10-CM | POA: Diagnosis not present

## 2020-07-02 DIAGNOSIS — O4692 Antepartum hemorrhage, unspecified, second trimester: Secondary | ICD-10-CM | POA: Insufficient documentation

## 2020-07-02 DIAGNOSIS — O26892 Other specified pregnancy related conditions, second trimester: Secondary | ICD-10-CM

## 2020-07-02 DIAGNOSIS — R109 Unspecified abdominal pain: Secondary | ICD-10-CM | POA: Diagnosis not present

## 2020-07-02 DIAGNOSIS — F17210 Nicotine dependence, cigarettes, uncomplicated: Secondary | ICD-10-CM | POA: Diagnosis not present

## 2020-07-02 DIAGNOSIS — N939 Abnormal uterine and vaginal bleeding, unspecified: Secondary | ICD-10-CM | POA: Diagnosis not present

## 2020-07-02 DIAGNOSIS — O208 Other hemorrhage in early pregnancy: Secondary | ICD-10-CM | POA: Diagnosis not present

## 2020-07-02 LAB — URINALYSIS, ROUTINE W REFLEX MICROSCOPIC
Bilirubin Urine: NEGATIVE
Glucose, UA: NEGATIVE mg/dL
Hgb urine dipstick: NEGATIVE
Ketones, ur: NEGATIVE mg/dL
Leukocytes,Ua: NEGATIVE
Nitrite: NEGATIVE
Protein, ur: NEGATIVE mg/dL
Specific Gravity, Urine: 1.009 (ref 1.005–1.030)
pH: 7 (ref 5.0–8.0)

## 2020-07-02 LAB — WET PREP, GENITAL
Clue Cells Wet Prep HPF POC: NONE SEEN
Sperm: NONE SEEN
Trich, Wet Prep: NONE SEEN
Yeast Wet Prep HPF POC: NONE SEEN

## 2020-07-02 NOTE — MAU Provider Note (Signed)
History     CSN: [id number]  Arrival date and time: 07/02/20 1053   First Provider Initiated Contact with Patient 07/02/20 1307      Chief Complaint  Patient presents with   Vaginal Bleeding   Darlene Reynolds is a 24 y.o. [id number] at [redacted]w[redacted]d by unsure LMP who presents with abdominal pain & vaginal bleeding.  Reports that her whole abdomen has been sore for the last week. Pain is worse with intercourse. Denies n/v/d, constipation, dysuria, or vaginal discharge. Did have some pink/brown vaginal bleeding yesterday until this morning. Currently no bleeding. No recent intercourse. Has dating ultrasound scheduled for next week.   Location: abdomen Quality: sore Severity: currently 0/10 on pain scale Duration: 1 week Timing: intermittent Modifying factors: worse with intercourse Associated signs and symptoms: vaginal bleeding    OB History    Gravida  4   Para  2   Term  2   Preterm      AB  1   Living  2     SAB      TAB  1   Ectopic      Multiple      Live Births  2           Past Medical History:  Diagnosis Date   Medical history non-contributory     Past Surgical History:  Procedure Laterality Date   CESAREAN SECTION     CESAREAN SECTION N/A 06/04/2019   Procedure: CESAREAN SECTION;  Surgeon:  Bing, MD;  Location: [facility];  Service: Obstetrics;  Laterality: N/A;   INDUCED ABORTION      History reviewed. No pertinent family history.  Social History   Tobacco Use   Smoking status: Current Every Day Smoker    Packs/day: 0.25   Smokeless tobacco: Never Used  Vaping Use   Vaping Use: Never used  Substance Use Topics   Alcohol use: Never   Drug use: Never    Allergies:  Allergies  Allergen Reactions   Sulfa Antibiotics Hives   Nickel Rash    Medications Prior to Admission  Medication Sig Dispense Refill Last Dose   acetaminophen (TYLENOL) 325 MG tablet Take 650 mg by mouth as needed (pain).    Past Week at Unknown  time   calcium carbonate (TUMS - DOSED IN MG ELEMENTAL CALCIUM) 500 MG chewable tablet Chew 1-2 tablets by mouth as needed for indigestion or heartburn.       ferrous sulfate 325 (65 FE) MG tablet Take 1 tablet (325 mg total) by mouth daily with breakfast. (Patient not taking: Reported on 12/28/2019) 30 tablet 3    phenazopyridine (PYRIDIUM) 95 MG tablet Take 95 mg by mouth 3 (three) times daily as needed for pain.       Review of Systems  Constitutional: Negative.   Gastrointestinal: Positive for abdominal pain. Negative for constipation, diarrhea, nausea and vomiting.  Genitourinary: Positive for vaginal bleeding. Negative for dysuria and vaginal discharge.   Physical Exam   Blood pressure 114/63, pulse 81, temperature 98.1 F (36.7 C), temperature source Oral, resp. rate 20, height 5\' 1"  (1.549 m), weight 54.5 kg, last menstrual period 02/23/2020, SpO2 100 %, unknown if currently breastfeeding.  Physical Exam Vitals and nursing note reviewed. Exam conducted with a chaperone present.  Constitutional:      General: She is not in acute distress.    Appearance: Normal appearance. She is normal weight.  HENT:     Head: Normocephalic and atraumatic.  Pulmonary:  Effort: Pulmonary effort is normal. No respiratory distress.  Abdominal:     Palpations: Abdomen is soft.     Tenderness: There is no abdominal tenderness. There is no guarding.  Genitourinary:    General: Normal vulva.     Exam position: Lithotomy position.     Cervix: No cervical motion tenderness, friability or cervical bleeding.     Uterus: Enlarged. Not tender.      Comments: Minimal amount of tan mucoid discharge. No blood. Cervix closed/thick. Uterus enlarged to 14-16 wks size Neurological:     Mental Status: She is alert.     MAU Course  Procedures Results for orders placed or performed during the hospital encounter of 07/02/20 (from the past 24 hour(s))  Urinalysis, Routine w reflex microscopic Urine, Clean  Catch     Status: None   Collection Time: 07/02/20 12:47 PM  Result Value Ref Range   Color, Urine YELLOW YELLOW   APPearance CLEAR CLEAR   Specific Gravity, Urine 1.009 1.005 - 1.030   pH 7.0 5.0 - 8.0   Glucose, UA NEGATIVE NEGATIVE mg/dL   Hgb urine dipstick NEGATIVE NEGATIVE   Bilirubin Urine NEGATIVE NEGATIVE   Ketones, ur NEGATIVE NEGATIVE mg/dL   Protein, ur NEGATIVE NEGATIVE mg/dL   Nitrite NEGATIVE NEGATIVE   Leukocytes,Ua NEGATIVE NEGATIVE  Wet prep, genital     Status: Abnormal   Collection Time: 07/02/20  1:08 PM   Specimen: PATH Cytology Cervicovaginal Ancillary Only  Result Value Ref Range   Yeast Wet Prep HPF POC NONE SEEN NONE SEEN   Trich, Wet Prep NONE SEEN NONE SEEN   Clue Cells Wet Prep HPF POC NONE SEEN NONE SEEN   WBC, Wet Prep HPF POC MODERATE (A) NONE SEEN   Sperm NONE SEEN    No results found.  MDM FHT present via doppler RH positive No blood on exam  Wet prep & GC/CT collected. Wet prep is negative.   Ultrasound ordered to assess placenta & cervical length. Patient is 14-[redacted] wks gestation based on unsure LMP. Left lateral placenta & cervical length is 4.5 cm.   Assessment and Plan   1. Abdominal pain during pregnancy in second trimester  -cervix closed & normal cervical length. Abdomen soft & non tender. U/a & wet prep negative. GC/CT pending  2. [redacted] weeks gestation of pregnancy   3. Vaginal bleeding in pregnancy, second trimester  -RH positive - no blood on exam. Reviewed bleeding precautions. No evidence of previa.  -Pt to keep scheduled dating ultrasound next week & start prenatal care     Judeth Horn 07/02/2020, 1:07 PM

## 2020-07-02 NOTE — MAU Note (Signed)
Presents with c/o VB that yesterday afternoon @ 1600.  Denies recent intercourse.  Reports having abdominal pain x1 week, states entire abdomen is sore.  States pregnancy was verified yesterday @ [facility] and was told she's approximately 14wks based on LMP.

## 2020-07-02 NOTE — Progress Notes (Signed)
Patient was assessed and managed by nursing staff during this encounter. I have reviewed the chart and agree with the documentation and plan. I have also made any necessary editorial changes.  [place] Bing, MD 07/02/2020 10:46 PM

## 2020-07-02 NOTE — ED Triage Notes (Signed)
Emergency Medicine Provider OB Triage Evaluation Note  Darlene Reynolds is a 24 y.o. female, G2P1001, at Unknown gestation who presents to the emergency department with complaints of vaginal bleeding, abdominal pain.  Positive pregnancy test.  Review of  Systems  Positive: Vaginal bleeding, abdominal pain Negative: Lightheadedness  Physical Exam  BP 131/82 (BP Location: Left Arm)    Pulse (!) 101    Temp 98.5 F (36.9 C) (Oral)    Resp 14    SpO2 100%  General: Awake, no distress  HEENT: Atraumatic  Resp: Normal effort  Cardiac: Normal rate Abd: Nondistended, nontender  MSK: Moves all extremities without difficulty Neuro: Speech clear  Medical Decision Making  Pt evaluated for pregnancy concern and is stable for transfer to MAU. Pt is in agreement with plan for transfer.  11:42 AM Discussed with MAU APP, who accepts patient in transfer.  Clinical Impression  No diagnosis found.     Sabas Sous, MD 07/02/20 1143

## 2020-07-02 NOTE — Discharge Instructions (Signed)
Vaginal Bleeding During Pregnancy, Second Trimester ° °A small amount of bleeding (spotting) from the vagina is relatively common during pregnancy. It usually stops on its own. Various things can cause spotting during pregnancy. Sometimes the bleeding is normal and is not a sign of a problem in the pregnancy. However, bleeding can also be a sign of something serious. Be sure to tell your health care provider about any vaginal bleeding right away. °Some possible causes of vaginal bleeding during the second trimester include: °· Infection, inflammation, or growths (polyps) on the cervix. °· A condition in which the placenta partially or completely covers the opening of the cervix inside the uterus (placenta previa). °· The placenta separating from the uterus (placenta abruption). °· Early (preterm) labor. °· The cervix opening and thinning before pregnancy is at term and before labor starts (cervical insufficiency). °· A mass of tissue developing in the uterus due to an egg being fertilized incorrectly (molar pregnancy). °Follow these instructions at home: °Activity °· Follow instructions from your health care provider about limiting your activity. Ask what activities are safe for you. °· If needed, make plans for someone to help with your regular activities. °· Do not exercise or do activities that take a lot of effort unless your health care provider approves. °· Do not lift anything that is heavier than 10 lb (4.5 kg), or the limit that your health care provider tells you, until he or she says that it is safe. °· Do not have sex or orgasms until your health care provider says that this is safe. °Medicines °· Take over-the-counter and prescription medicines only as told by your health care provider. °· Do not take aspirin because it can cause bleeding. °General instructions °· Pay attention to any changes in your symptoms. °· Write down how many pads you use each day, how often you change pads, and how soaked  (saturated) they are. °· Do not use tampons or douche. °· If you pass any tissue from your vagina, save the tissue so you can show it to your health care provider. °· Keep all follow-up visits as told by your health care provider. This is important. °Contact a health care provider if: °· You have vaginal bleeding during any time of your pregnancy. °· You have cramps or labor pains. °· You have a fever that does not get better when you take medicines. °Get help right away if: °· You have severe cramps in your back or abdomen. °· You have contractions. °· You have chills. °· You pass large clots or a large amount of tissue from your vagina. °· Your bleeding increases. °· You feel light-headed or weak, or you faint. °· You are leaking fluid or have a gush of fluid from your vagina. °Summary °· Various things can cause bleeding or spotting in pregnancy. °· Be sure to tell your health care provider about any vaginal bleeding right away. °· Follow instructions from your health care provider about limiting your activity. Ask what activities are safe for you. °This information is not intended to replace advice given to you by your health care provider. Make sure you discuss any questions you have with your health care provider. °Document Revised: 01/03/2019 Document Reviewed: 12/17/2016 °Elsevier Patient Education © 2020 Elsevier Inc. ° °

## 2020-07-03 LAB — GC/CHLAMYDIA PROBE AMP (~~LOC~~) NOT AT ARMC
Chlamydia: NEGATIVE
Comment: NEGATIVE
Comment: NORMAL
Neisseria Gonorrhea: NEGATIVE

## 2020-07-10 ENCOUNTER — Ambulatory Visit: Admission: RE | Admit: 2020-07-10 | Payer: Medicaid Other | Source: Ambulatory Visit

## 2020-07-22 ENCOUNTER — Telehealth (INDEPENDENT_AMBULATORY_CARE_PROVIDER_SITE_OTHER): Payer: Medicaid Other | Admitting: *Deleted

## 2020-07-22 DIAGNOSIS — Z3490 Encounter for supervision of normal pregnancy, unspecified, unspecified trimester: Secondary | ICD-10-CM

## 2020-07-22 NOTE — Progress Notes (Addendum)
2:15 Letanya not connected virtually for her visit today. I called number on file and a female answered and said he is at work and she is not there. He agreed to give her a message to call us about her appointment.  I also called her contact number and left a message I am trying to reach Berta and you are listed as a contact- please give her a message to call us regarding her appointment.  2:21 Patient not connected virtually . Unable to do virtual visit today.  Brittiny Levitz,RN

## 2020-07-24 ENCOUNTER — Encounter: Payer: Medicaid Other | Admitting: Nurse Practitioner

## 2020-07-25 DIAGNOSIS — Z3482 Encounter for supervision of other normal pregnancy, second trimester: Secondary | ICD-10-CM | POA: Diagnosis not present

## 2020-07-25 LAB — OB RESULTS CONSOLE ABO/RH: RH Type: POSITIVE

## 2020-07-25 LAB — OB RESULTS CONSOLE PLATELET COUNT: Platelets: 348

## 2020-07-25 LAB — OB RESULTS CONSOLE HGB/HCT, BLOOD
HCT: 31 (ref 29–41)
Hemoglobin: 9.8

## 2020-07-25 LAB — URINE CULTURE: Urine Culture, OB: NEGATIVE

## 2020-07-25 LAB — OB RESULTS CONSOLE HIV ANTIBODY (ROUTINE TESTING): HIV: NONREACTIVE

## 2020-07-25 LAB — OB RESULTS CONSOLE GC/CHLAMYDIA
Chlamydia: NEGATIVE
Gonorrhea: NEGATIVE

## 2020-07-25 LAB — OB RESULTS CONSOLE RPR
RPR: NONREACTIVE
RPR: NONREACTIVE

## 2020-07-25 LAB — OB RESULTS CONSOLE HEPATITIS B SURFACE ANTIGEN: Hepatitis B Surface Ag: NEGATIVE

## 2020-07-25 LAB — OB RESULTS CONSOLE ANTIBODY SCREEN: Antibody Screen: NEGATIVE

## 2020-08-01 ENCOUNTER — Inpatient Hospital Stay (HOSPITAL_COMMUNITY)
Admission: AD | Admit: 2020-08-01 | Discharge: 2020-08-01 | Disposition: A | Discharge status: Home / Self Care | Payer: Medicaid Other | Attending: Obstetrics and Gynecology | Admitting: Obstetrics and Gynecology

## 2020-08-01 ENCOUNTER — Other Ambulatory Visit: Payer: Self-pay

## 2020-08-01 ENCOUNTER — Encounter (HOSPITAL_COMMUNITY): Payer: Self-pay | Admitting: Obstetrics and Gynecology

## 2020-08-01 ENCOUNTER — Inpatient Hospital Stay (HOSPITAL_BASED_OUTPATIENT_CLINIC_OR_DEPARTMENT_OTHER): Payer: Medicaid Other

## 2020-08-01 DIAGNOSIS — Z87891 Personal history of nicotine dependence: Secondary | ICD-10-CM | POA: Insufficient documentation

## 2020-08-01 DIAGNOSIS — Z3A19 19 weeks gestation of pregnancy: Secondary | ICD-10-CM

## 2020-08-01 DIAGNOSIS — O4692 Antepartum hemorrhage, unspecified, second trimester: Secondary | ICD-10-CM

## 2020-08-01 DIAGNOSIS — D509 Iron deficiency anemia, unspecified: Secondary | ICD-10-CM

## 2020-08-01 DIAGNOSIS — O4590 Premature separation of placenta, unspecified, unspecified trimester: Secondary | ICD-10-CM | POA: Diagnosis present

## 2020-08-01 DIAGNOSIS — Z3687 Encounter for antenatal screening for uncertain dates: Secondary | ICD-10-CM

## 2020-08-01 DIAGNOSIS — O0932 Supervision of pregnancy with insufficient antenatal care, second trimester: Secondary | ICD-10-CM | POA: Diagnosis not present

## 2020-08-01 DIAGNOSIS — O34219 Maternal care for unspecified type scar from previous cesarean delivery: Secondary | ICD-10-CM | POA: Diagnosis not present

## 2020-08-01 DIAGNOSIS — Z3A22 22 weeks gestation of pregnancy: Secondary | ICD-10-CM | POA: Diagnosis not present

## 2020-08-01 DIAGNOSIS — O4592 Premature separation of placenta, unspecified, second trimester: Secondary | ICD-10-CM | POA: Diagnosis not present

## 2020-08-01 HISTORY — DX: Anemia, unspecified: D64.9

## 2020-08-01 LAB — CBC
HCT: 29.9 % — ABNORMAL LOW (ref 36.0–46.0)
Hemoglobin: 9.1 g/dL — ABNORMAL LOW (ref 12.0–15.0)
MCH: 22.5 pg — ABNORMAL LOW (ref 26.0–34.0)
MCHC: 30.4 g/dL (ref 30.0–36.0)
MCV: 73.8 fL — ABNORMAL LOW (ref 80.0–100.0)
Platelets: 272 10*3/uL (ref 150–400)
RBC: 4.05 MIL/uL (ref 3.87–5.11)
RDW: 17.5 % — ABNORMAL HIGH (ref 11.5–15.5)
WBC: 16.6 10*3/uL — ABNORMAL HIGH (ref 4.0–10.5)
nRBC: 0 % (ref 0.0–0.2)

## 2020-08-01 LAB — RAPID URINE DRUG SCREEN, HOSP PERFORMED
Amphetamines: NOT DETECTED
Barbiturates: NOT DETECTED
Benzodiazepines: NOT DETECTED
Cocaine: NOT DETECTED
Opiates: NOT DETECTED
Tetrahydrocannabinol: POSITIVE — AB

## 2020-08-01 MED ORDER — BETAMETHASONE SOD PHOS & ACET 6 (3-3) MG/ML IJ SUSP: 12.0000 mg | INTRAMUSCULAR | Status: DC

## 2020-08-01 MED ORDER — ONDANSETRON 4 MG PO TBDP
8.0000 mg | ORAL_TABLET | Freq: Once | ORAL | Status: AC
  Administered 2020-08-01: 8 mg via ORAL
  Filled 2020-08-01: qty 2

## 2020-08-01 MED ORDER — OXYCODONE-ACETAMINOPHEN 5-325 MG PO TABS
2.0000 | ORAL_TABLET | ORAL | Status: DC | PRN
  Administered 2020-08-01: 2 via ORAL
  Filled 2020-08-01: qty 2

## 2020-08-01 NOTE — MAU Provider Note (Signed)
Patient Darlene Reynolds is a 24 y.o. [phone] at [redacted]w[redacted]d here due to vaginal bleeding and low back pain. This started at 730 am. She denies any complication in this pregnancy other than a brief episode of bleeding at 18 weeks. She endorses feeling pain in her abdomen, feeling lightheaded and nauseated. Feels lots of "pressure like she has to use the bathroom" in her lower abdomen.   She started having a "tight feeling" in her chest and feels like "her  whole bottom fell out."    She denies dysuria, other vaginal discharge, SOB, fever, chest pain.   History     CSN: [id number]  Arrival date and time: 08/01/20 0800   None     Chief Complaint  Patient presents with   Vaginal Bleeding   Abdominal Pain   Vaginal Bleeding The patient's primary symptoms include vaginal bleeding. This is a new problem. The current episode started today. The problem occurs constantly. The problem has been unchanged. She is pregnant. Associated symptoms include abdominal pain. The vaginal discharge was bloody and watery. The vaginal bleeding is heavier than menses. She has not been passing clots. She has not been passing tissue.  Abdominal Pain This is a new problem. The current episode started today. The onset quality is sudden. The problem occurs intermittently. The pain is located in the periumbilical region and suprapubic region. The pain is at a severity of 8/10.    OB History     Gravida  4   Para  2   Term  2   Preterm      AB  1   Living  2      SAB      TAB  1   Ectopic      Multiple      Live Births  2           Past Medical History:  Diagnosis Date   Anemia    Medical history non-contributory     Past Surgical History:  Procedure Laterality Date   CESAREAN SECTION     CESAREAN SECTION N/A 06/04/2019   Procedure: CESAREAN SECTION;  Surgeon: Boykin Bing, MD;  Location: [facility] ORS;  Service: Obstetrics;  Laterality: N/A;   INDUCED ABORTION      History reviewed. No  pertinent family history.  Social History   Tobacco Use   Smoking status: Former Smoker    Packs/day: 0.25    Quit date: 06/01/2020    Years since quitting: 0.1   Smokeless tobacco: Never Used  Vaping Use   Vaping Use: Never used  Substance Use Topics   Alcohol use: Never   Drug use: Never    Allergies:  Allergies  Allergen Reactions   Sulfa Antibiotics Hives   Nickel Rash    Medications Prior to Admission  Medication Sig Dispense Refill Last Dose   Prenatal Vit-Fe Fumarate-FA (PRENATAL MULTIVITAMIN) TABS tablet Take 1 tablet by mouth daily at 12 noon.      acetaminophen (TYLENOL) 325 MG tablet Take 650 mg by mouth as needed (pain).        Review of Systems  Constitutional: Negative.   HENT: Negative.   Gastrointestinal: Positive for abdominal pain.  Genitourinary: Positive for vaginal bleeding.  Psychiatric/Behavioral: Negative.    Physical Exam   Blood pressure (!) 99/59, pulse 63, resp. rate 18, height 5\' 1"  (1.549 m), weight 58.5 kg, last menstrual period 02/23/2020, SpO2 100 %, unknown if currently breastfeeding.  Physical Exam Constitutional:  Appearance: She is well-developed.  HENT:     Head: Normocephalic.  Abdominal:     Palpations: Abdomen is soft.  Genitourinary:    Vagina: Vaginal discharge and bleeding present.     Comments: NEFG; perineum with dried blood, dark brown blood in the vaginal vault, no clots, no tissue, cervix is long, FT and thick.  Neurological:     Mental Status: She is alert.     MAU Course  Procedures  MDM -US shows placental abruption, also echogenic focus -patient had percocet and zofran, initially very uncomfortable in MAU but now at 1045 she appears calm, reports 0/10 pain. Does not want anything to eat or drink yet, has not left a urine specimen.   -H/H is 9.1/29.9 1257: Patient resting in bed, denies any pain or bleeding at this time. She feels a little bit of "tightening" in her belly. She denies chest pain.    Assessment and Plan   1. Placental abruption in second trimester    2. Patient stable for discharge with bleeding precautions, return to hospital if heavy bleeding or increased pain, LOC, fever or other concerning symptoms.   3. Message sent to [facility] to start prenatal care as that is where patient was initially scheduled to go.   4. Patient to start taking iron pills that she has at home.   5. All questions answered; patient stable for discharge.   Charlesetta Garibaldi Stpehen Petitjean 08/01/2020, 8:41 AM

## 2020-08-01 NOTE — Discharge Instructions (Signed)
Placental Abruption  The placenta is the organ formed during pregnancy. It carries oxygen and nutrients to the unborn baby (fetus). The placenta is the baby's life support system. In normal circumstances, it remains attached to the inside of the uterus until the baby is born. Placental abruption is a condition in which the placenta partly or completely separates from the uterus before the baby is born. Placental abruption is rare, but it can happen any time after 20 weeks of pregnancy. A small separation may not cause problems, but a large separation may be dangerous for you and your baby. A large separation is usually an emergency that requires treatment right away. What are the causes? In most cases, the cause of this condition is not known. What increases the risk? This condition is more likely to develop in women who:  Have experienced a recent trauma such as a fall, an injury to the abdomen, or a car accident.  Have had a previous placental abruption.  Have high blood pressure.  Smoke cigarettes, use alcohol, or use drugs, such as cocaine.  Have multiples (twins, triplets, or more).  Have too much amniotic fluid (polyhydramnios).  Are 35 years of age or older. What are the signs or symptoms? Symptoms of this condition can be mild or severe. A small placental abruption may not cause symptoms, or it may cause mild symptoms, which may include:  Mild pain in the abdomen or lower back.  Slight bleeding in the vagina. A severe placental abruption will cause symptoms. The symptoms will depend on the size of the separation and the stage of pregnancy. They may include:  Severe pain in the abdomen or lower back.  Bleeding from the vagina.  Ongoing contractions of your uterus with little or no relaxation of your uterus between contractions. How is this diagnosed? There are no tests to diagnose placental abruption. However, your health care provider may suspect that you have this  condition based on:  Your symptoms.  A physical exam.  Ultrasound findings.  Blood tests. How is this treated? Treatment for placental abruption depends on the severity of the condition.  Mild cases may be monitored through close observation. You may be admitted to the hospital during this time.  Severe cases may require emergency treatment. This may involve: ? Admission to the hospital. ? Emergency cesarean delivery of your baby. ? A blood transfusion or other fluids given through an IV. Follow these instructions at home: Activity  Get plenty of rest and sleep.  Do not have sex until your health care provider says it is okay. Lifestyle  Do not use drugs.  Do not drink alcohol.  Do not use tampons or douche unless your health care provider says it is okay.  Do not use any products that contain nicotine or tobacco, such as cigarettes, e-cigarettes, and chewing tobacco. If you need help quitting, ask your health care provider. General instructions  Take over-the-counter and prescription medicines only as told by your health care provider.  Do not take any medicines that your health care provider has not approved.  Arrange for help at home so you can get adequate rest.  Keep all follow-up visits as told by your health care provider. This is important. Contact a health care provider if:  You have vaginal spotting.  You are having mild, regular contractions. Get help right away if:  You have moderate or heavy vaginal bleeding or spotting.  You have severe pain in the abdomen.  You have continuous uterine   contractions.  You have a hard, tender uterus.  You have any type of trauma, such as an injury to the abdomen, a fall, or a car accident.  You do not feel the baby move, or the baby moves very little. Summary  Placental abruption is a condition in which the placenta partly or completely separates from the uterus before the baby is born.  Placental abruption  is a medical emergency that requires treatment right away.  Contact a health care provider if you have vaginal bleeding, or if you have mild, regular contractions.  Get help right away if you have heavy vaginal bleeding, severe pain in the abdomen, continuous uterine contractions, or you do not feel the baby move.  Keep all follow-up visits as told by your health care provider. This is important. This information is not intended to replace advice given to you by your health care provider. Make sure you discuss any questions you have with your health care provider. Document Revised: 03/09/2019 Document Reviewed: 03/09/2019 Elsevier Patient Education  2020 Elsevier Inc.  

## 2020-08-01 NOTE — MAU Note (Signed)
Initial EDD (by  ?LMP) off per [place] today, pt not as far along. CNM cancelled Betamethasone.

## 2020-08-01 NOTE — MAU Note (Signed)
Pt reports she started having abd pain and cramping about 20 min ago and blood rushed out of her vagina. Every time she cramps she feels tightness in her chest.

## 2020-08-02 ENCOUNTER — Telehealth: Payer: Self-pay | Admitting: Family Medicine

## 2020-08-02 NOTE — Telephone Encounter (Signed)
Attempted to reach patient by phone number listed in Epic about rescheduling her New OB appointment. Spoke with a young man that stated he would give her a message to call the number on his phone. I asked him to have her call the office before 11:00 am today, or after 8:00 on Monday. He stated he would give her the message.

## 2020-08-05 ENCOUNTER — Encounter: Payer: Self-pay | Admitting: *Deleted

## 2020-08-07 ENCOUNTER — Other Ambulatory Visit: Payer: Self-pay

## 2020-08-07 ENCOUNTER — Encounter: Payer: Self-pay | Admitting: General Practice

## 2020-08-07 ENCOUNTER — Inpatient Hospital Stay (HOSPITAL_BASED_OUTPATIENT_CLINIC_OR_DEPARTMENT_OTHER): Payer: Medicaid Other

## 2020-08-07 ENCOUNTER — Inpatient Hospital Stay (HOSPITAL_COMMUNITY)
Admission: AD | Admit: 2020-08-07 | Discharge: 2020-08-07 | Discharge status: Against Medical Advice | Payer: Medicaid Other | Attending: Obstetrics and Gynecology | Admitting: Obstetrics and Gynecology

## 2020-08-07 ENCOUNTER — Encounter (HOSPITAL_COMMUNITY): Payer: Self-pay | Admitting: Obstetrics and Gynecology

## 2020-08-07 ENCOUNTER — Telehealth: Payer: Self-pay | Admitting: Family Medicine

## 2020-08-07 DIAGNOSIS — Z3A19 19 weeks gestation of pregnancy: Secondary | ICD-10-CM | POA: Diagnosis not present

## 2020-08-07 DIAGNOSIS — N760 Acute vaginitis: Secondary | ICD-10-CM

## 2020-08-07 DIAGNOSIS — O4692 Antepartum hemorrhage, unspecified, second trimester: Secondary | ICD-10-CM

## 2020-08-07 DIAGNOSIS — O4592 Premature separation of placenta, unspecified, second trimester: Secondary | ICD-10-CM

## 2020-08-07 DIAGNOSIS — B9689 Other specified bacterial agents as the cause of diseases classified elsewhere: Secondary | ICD-10-CM

## 2020-08-07 DIAGNOSIS — O23592 Infection of other part of genital tract in pregnancy, second trimester: Secondary | ICD-10-CM | POA: Insufficient documentation

## 2020-08-07 DIAGNOSIS — Z87891 Personal history of nicotine dependence: Secondary | ICD-10-CM | POA: Diagnosis not present

## 2020-08-07 DIAGNOSIS — O99891 Other specified diseases and conditions complicating pregnancy: Secondary | ICD-10-CM

## 2020-08-07 LAB — URINALYSIS, ROUTINE W REFLEX MICROSCOPIC
Bacteria, UA: NONE SEEN
Bilirubin Urine: NEGATIVE
Glucose, UA: NEGATIVE mg/dL
Ketones, ur: NEGATIVE mg/dL
Leukocytes,Ua: NEGATIVE
Nitrite: NEGATIVE
Protein, ur: NEGATIVE mg/dL
RBC / HPF: >50 RBC/hpf — ABNORMAL HIGH (ref 0–5)
Specific Gravity, Urine: 1.028 (ref 1.005–1.030)
pH: 5 (ref 5.0–8.0)

## 2020-08-07 LAB — WET PREP, GENITAL
Sperm: NONE SEEN
Trich, Wet Prep: NONE SEEN
Yeast Wet Prep HPF POC: NONE SEEN

## 2020-08-07 LAB — CBC
HCT: 30.4 % — ABNORMAL LOW (ref 36.0–46.0)
Hemoglobin: 9.2 g/dL — ABNORMAL LOW (ref 12.0–15.0)
MCH: 22.5 pg — ABNORMAL LOW (ref 26.0–34.0)
MCHC: 30.3 g/dL (ref 30.0–36.0)
MCV: 74.3 fL — ABNORMAL LOW (ref 80.0–100.0)
Platelets: 312 10*3/uL (ref 150–400)
RBC: 4.09 MIL/uL (ref 3.87–5.11)
RDW: 17.1 % — ABNORMAL HIGH (ref 11.5–15.5)
WBC: 12.6 10*3/uL — ABNORMAL HIGH (ref 4.0–10.5)
nRBC: 0 % (ref 0.0–0.2)

## 2020-08-07 NOTE — Telephone Encounter (Signed)
Went to the ER twice, still having bleeding. It was  A brown color and now its bright Red, having clots, not cramping. Went through 5 pads during the night.

## 2020-08-07 NOTE — MAU Provider Note (Addendum)
History     CSN: [id number]  Arrival date and time: 08/07/20 1331   First Provider Initiated Contact with Patient 08/07/20 1451      Chief Complaint  Patient presents with  . Vaginal Bleeding   HPI  Patient is G4P2 at [redacted]w[redacted]d presents for vaginal bleeding. Patient was diagnosed with placental abruption on 08/01/2020 in [facility]U. Patient reports she started having vaginal bleeding yesterday that progressively worsened last night and noted passing large clots. Patient notes she changed her pad 4 times throughout the night last night and twice today. She states the bleeding has slowed down but she still continues to have bleeding. Patient reports mild lower abdominal pain that radiates to her lower back. Patient denies dizziness, HA, nausea, vomiting, dysuria, frequency, or leg swelling. Patient notes she has recently felt flutters from the baby and continues to feel the movement.  OB History     Gravida  4   Para  2   Term  2   Preterm  0   AB  1   Living  2      SAB  0   TAB  1   Ectopic  0   Multiple  0   Live Births  2        Obstetric Comments  C/s 1- breech C/s 2 attempted TOLAC baby did not tolerate         Past Medical History:  Diagnosis Date  . Anemia   . Medical history non-contributory     Past Surgical History:  Procedure Laterality Date  . CESAREAN SECTION    . CESAREAN SECTION N/A 06/04/2019   Procedure: CESAREAN SECTION;  Surgeon: Sublette Bing, MD;  Location: [facility];  Service: Obstetrics;  Laterality: N/A;  . INDUCED ABORTION      Family History  Problem Relation Age of Onset  . Healthy Mother   . Healthy Father     Social History   Tobacco Use  . Smoking status: Former Smoker    Packs/day: 0.25    Quit date: 06/01/2020    Years since quitting: 0.1  . Smokeless tobacco: Never Used  Vaping Use  . Vaping Use: Never used  Substance Use Topics  . Alcohol use: Never  . Drug use: Never    Allergies:  Allergies  Allergen  Reactions  . Sulfa Antibiotics Hives  . Nickel Rash    Medications Prior to Admission  Medication Sig Dispense Refill Last Dose  . acetaminophen (TYLENOL) 325 MG tablet Take 650 mg by mouth as needed (pain).    Past Week at Unknown time  . Prenatal Vit-Fe Fumarate-FA (PRENATAL MULTIVITAMIN) TABS tablet Take 1 tablet by mouth daily at 12 noon.   08/07/2020 at Unknown time    Review of Systems  Constitutional: Negative for fever.  Respiratory: Negative for shortness of breath.   Cardiovascular: Negative for chest pain and leg swelling.  Gastrointestinal: Positive for abdominal pain. Negative for diarrhea, nausea and vomiting.  Genitourinary: Positive for pelvic pain and vaginal bleeding. Negative for dysuria, flank pain, frequency, vaginal discharge and vaginal pain.  Musculoskeletal: Positive for back pain (Low back pain).  Neurological: Negative for dizziness and headaches.   Physical Exam   Blood pressure 103/66, pulse 78, temperature 98 F (36.7 C), resp. rate 18, weight 56.2 kg, last menstrual period 02/23/2020, SpO2 100 %, unknown if currently breastfeeding.  Physical Exam Exam conducted with a chaperone present.  Constitutional:      Appearance: Normal appearance.  HENT:  Head: Normocephalic and atraumatic.  Pulmonary:     Effort: Pulmonary effort is normal.  Abdominal:     General: There is no distension.     Palpations: Abdomen is soft.     Tenderness: There is abdominal tenderness. There is no right CVA tenderness or left CVA tenderness.  Genitourinary:    General: Normal vulva.     Cervix: Erythema and cervical bleeding present. No friability.  Musculoskeletal:     Cervical back: Normal range of motion and neck supple.     Right lower leg: No edema.     Left lower leg: No edema.  Skin:    General: Skin is warm and dry.  Neurological:     General: No focal deficit present.     Mental Status: She is alert and oriented to person, place, and time.   Psychiatric:        Mood and Affect: Mood normal.        Behavior: Behavior normal.     MAU Course  Procedures Results for orders placed or performed during the hospital encounter of 08/07/20 (from the past 24 hour(s))  Urinalysis, Routine w reflex microscopic Urine, Clean Catch     Status: Abnormal   Collection Time: 08/07/20  2:20 PM  Result Value Ref Range   Color, Urine YELLOW YELLOW   APPearance HAZY (A) CLEAR   Specific Gravity, Urine 1.028 1.005 - 1.030   pH 5.0 5.0 - 8.0   Glucose, UA NEGATIVE NEGATIVE mg/dL   Hgb urine dipstick MODERATE (A) NEGATIVE   Bilirubin Urine NEGATIVE NEGATIVE   Ketones, ur NEGATIVE NEGATIVE mg/dL   Protein, ur NEGATIVE NEGATIVE mg/dL   Nitrite NEGATIVE NEGATIVE   Leukocytes,Ua NEGATIVE NEGATIVE   RBC / HPF >50 (H) 0 - 5 RBC/hpf   WBC, UA 0-5 0 - 5 WBC/hpf   Bacteria, UA NONE SEEN NONE SEEN   Squamous Epithelial / LPF 0-5 0 - 5   Mucus PRESENT   Wet prep, genital     Status: Abnormal   Collection Time: 08/07/20  3:08 PM  Result Value Ref Range   Yeast Wet Prep HPF POC NONE SEEN NONE SEEN   Trich, Wet Prep NONE SEEN NONE SEEN   Clue Cells Wet Prep HPF POC PRESENT (A) NONE SEEN   WBC, Wet Prep HPF POC MANY (A) NONE SEEN   Sperm NONE SEEN   CBC     Status: Abnormal   Collection Time: 08/07/20  3:35 PM  Result Value Ref Range   WBC 12.6 (H) 4.0 - 10.5 K/uL   RBC 4.09 3.87 - 5.11 MIL/uL   Hemoglobin 9.2 (L) 12.0 - 15.0 g/dL   HCT 40.1 (L) 36 - 46 %   MCV 74.3 (L) 80.0 - 100.0 fL   MCH 22.5 (L) 26.0 - 34.0 pg   MCHC 30.3 30.0 - 36.0 g/dL   RDW 02.7 (H) 25.3 - 66.4 %   Platelets 312 150 - 400 K/uL   nRBC 0.0 0.0 - 0.2 %   MDM Patient presents with vaginal bleeding and history of known placental abruption.  UA showed presence of RBCs. Wet prep completed and showed clue cells indicating patient has BV. GC/chlamydia results pending. CBC shows Hgb stable at 9.2 as compared to 9.1 six days ago.   [place] completed to evaluate bleeding and  placental abruption. [place] final results not completed, but preliminary results showed oligohydramnios with largest pocket of fluid being 1.5cm.   Assessment and Plan  1. Vaginal  bleeding with known placental abruption. 2. Bacterial vaginosis  Dr. Shawnie Pons consulted suggested to inform the patient of the preliminary ultrasound findings and tell her that her water has most likely broken, and pregnancy is at a high risk for spontaneous abortion.  Patient is to be instructed to follow up with a provider in office tomorrow to review final [place] results. Patient needs to follow up in one week for repeat ultrasound. Patient to be instructed to return to MAU if bleeding or vaginal discharge increases or if she passes more clots. Patient to be treated with Flagyl 500mg  BID x7days for BV.  Patient left AMA before instructions were given to her after extensive conversation about the importance of waiting for the results as the preliminary findings are concerning for abnormal findings.  [place], Gigi Gin- Student 08/07/2020, 4:50 PM   Attestation of Supervision of Student:  I confirm that I have verified the information documented in the physician assistant student's note and that I have also personally reperformed the history, physical exam and all medical decision making activities.  I have verified that all services and findings are accurately documented in this student's note; and I agree with management and plan as outlined in the documentation. I have also made any necessary editorial changes. I was present for the entire encounter.      *Consult with Dr. 13/06/2020 @ [phone] - requesting U/S report be finalized. Verbal report given by Dr. [id number] stating that AFI was 1.5 cm. Dr. Grace Bushy will get final report read in 1 hour; no computer access at this time. Dr. Grace Bushy notified of Dr. Shawnie Pons verbal report and recommends that patient should see provider tomorrow and strict bleeding precautions.  Message  sent to [facility] Admin Pool to call patient to get scheduled for appt with provider.  Ivar Bury, CNM [facility], [facility] 08/07/2020 7:19 PM

## 2020-08-07 NOTE — MAU Note (Signed)
Heavier bleeding started around midnight.  Soaked 3 pads in 2 hrs. Bleeding today has been more of a bright red.  Some light cramps.

## 2020-08-07 NOTE — MAU Note (Signed)
. °  Darlene Reynolds is a 24 y.o. at [redacted]w[redacted]d here in MAU reporting: heavier vaginal bleeding with clots. Pt has a known abruption. Reports lower abdominal cramping and lower back pain   Onset of complaint: ongoing Pain score: 2 Vitals:   08/07/20 1423  BP: 103/66  Pulse: 78  Resp: 18  Temp: 98 F (36.7 C)  SpO2: 100%     FHT:157 Lab orders placed from triage: UA

## 2020-08-07 NOTE — MAU Note (Signed)
Patient walked out without signing AMA form. Patient states she had family emergency and was leaving.

## 2020-08-07 NOTE — Telephone Encounter (Signed)
Spoke with patient on the phone. She reports bright red bleeding started last night around midnight with passing quarter sized to golf ball sized clots. Patient also reports mild cramping. Patient states she is bleeding heavier than a normal period. She bought bladder control diapers last night and has already gone through 5 of them. Discussed with Luna Kitchens who states patient should go to MAU. Informed patient. Patient verbalized understanding. MAU called & notified.

## 2020-08-08 ENCOUNTER — Encounter: Payer: Medicaid Other | Admitting: Obstetrics & Gynecology

## 2020-08-08 ENCOUNTER — Telehealth: Payer: Self-pay | Admitting: Obstetrics & Gynecology

## 2020-08-08 NOTE — Telephone Encounter (Signed)
Attempted to reach patient concerning her appointment. Left a detailed message for her to call the office about her appointment scheduled for today.

## 2020-08-08 NOTE — Telephone Encounter (Signed)
Attempted to reach patient about her scheduled appointment. Left a voicemail message for her to call the office.

## 2020-08-09 LAB — GC/CHLAMYDIA PROBE AMP (~~LOC~~) NOT AT ARMC
Chlamydia: NEGATIVE
Comment: NEGATIVE
Comment: NORMAL
Neisseria Gonorrhea: NEGATIVE

## 2020-08-12 ENCOUNTER — Encounter: Payer: Medicaid Other | Admitting: Certified Nurse Midwife

## 2020-08-13 ENCOUNTER — Telehealth: Payer: Self-pay | Admitting: General Practice

## 2020-08-13 ENCOUNTER — Encounter: Payer: Self-pay | Admitting: Obstetrics and Gynecology

## 2020-08-13 DIAGNOSIS — M545 Low back pain, unspecified: Secondary | ICD-10-CM | POA: Diagnosis not present

## 2020-08-13 DIAGNOSIS — O218 Other vomiting complicating pregnancy: Secondary | ICD-10-CM | POA: Diagnosis not present

## 2020-08-13 DIAGNOSIS — O4102X0 Oligohydramnios, second trimester, not applicable or unspecified: Secondary | ICD-10-CM | POA: Diagnosis not present

## 2020-08-13 DIAGNOSIS — M79651 Pain in right thigh: Secondary | ICD-10-CM | POA: Diagnosis not present

## 2020-08-13 DIAGNOSIS — M79652 Pain in left thigh: Secondary | ICD-10-CM | POA: Diagnosis not present

## 2020-08-13 DIAGNOSIS — O4692 Antepartum hemorrhage, unspecified, second trimester: Secondary | ICD-10-CM | POA: Diagnosis not present

## 2020-08-13 DIAGNOSIS — O321XX0 Maternal care for breech presentation, not applicable or unspecified: Secondary | ICD-10-CM | POA: Diagnosis not present

## 2020-08-13 DIAGNOSIS — O99332 Smoking (tobacco) complicating pregnancy, second trimester: Secondary | ICD-10-CM | POA: Diagnosis not present

## 2020-08-13 DIAGNOSIS — O99891 Other specified diseases and conditions complicating pregnancy: Secondary | ICD-10-CM | POA: Diagnosis not present

## 2020-08-13 DIAGNOSIS — Z3A20 20 weeks gestation of pregnancy: Secondary | ICD-10-CM | POA: Diagnosis not present

## 2020-08-13 NOTE — Telephone Encounter (Signed)
Patient called and left message on nurse voicemail line stating she has recently moved and needs a referral to an ob gyn so she can start care.  Called patient and asked where she has moved. Patient states [place]. Asked patient what OB GYN office she is trying to go to but patient states any of them and they are all telling her to go to the ER there. Patient states she was also told she needed a referral. Asked patient to call the office she is interested in and see if they have a referral form for [place]. Discussed she will need to sign a release of information for [place] to fax her records. Asked patient if she was aware of her ultrasound results from MAU on 11/10. Patient states no because she was discharged. Reviewed ultrasound results with patient and concern of SAB. Patient became immediately upset and started crying then hung up. Called the patient back and reviewed ultrasound report with her in detail. Asked patient what office specifically she wanted to go to and we could send her records with a verbal release from her. Patient requested records be sent to [facility] in [place] and authorization of release was confirmed verbally to myself and Ralene Bathe, Charity fundraiser. Told patient we would send her records to them but advised she go to ER there in the meantime. Discussed that would give her some answers and make her feel better as it may be several days before the office could see her. Patient verbalized understanding.

## 2020-08-14 ENCOUNTER — Encounter: Payer: Self-pay | Admitting: *Deleted

## 2020-08-14 DIAGNOSIS — Z8659 Personal history of other mental and behavioral disorders: Secondary | ICD-10-CM | POA: Insufficient documentation

## 2020-08-14 DIAGNOSIS — Z8759 Personal history of other complications of pregnancy, childbirth and the puerperium: Secondary | ICD-10-CM

## 2020-08-19 DIAGNOSIS — Z3A21 21 weeks gestation of pregnancy: Secondary | ICD-10-CM | POA: Diagnosis not present

## 2020-08-19 DIAGNOSIS — Z20822 Contact with and (suspected) exposure to covid-19: Secondary | ICD-10-CM | POA: Diagnosis not present

## 2020-08-19 DIAGNOSIS — O4592 Premature separation of placenta, unspecified, second trimester: Secondary | ICD-10-CM | POA: Diagnosis not present

## 2020-08-19 DIAGNOSIS — O365920 Maternal care for other known or suspected poor fetal growth, second trimester, not applicable or unspecified: Secondary | ICD-10-CM | POA: Diagnosis not present

## 2020-08-19 DIAGNOSIS — O42912 Preterm premature rupture of membranes, unspecified as to length of time between rupture and onset of labor, second trimester: Secondary | ICD-10-CM | POA: Diagnosis not present

## 2020-08-19 DIAGNOSIS — O4412 Placenta previa with hemorrhage, second trimester: Secondary | ICD-10-CM | POA: Diagnosis not present

## 2020-08-21 DIAGNOSIS — O42912 Preterm premature rupture of membranes, unspecified as to length of time between rupture and onset of labor, second trimester: Secondary | ICD-10-CM | POA: Diagnosis not present

## 2020-08-21 DIAGNOSIS — O321XX0 Maternal care for breech presentation, not applicable or unspecified: Secondary | ICD-10-CM | POA: Diagnosis not present

## 2020-08-21 DIAGNOSIS — O4592 Premature separation of placenta, unspecified, second trimester: Secondary | ICD-10-CM | POA: Diagnosis not present

## 2020-08-21 DIAGNOSIS — Z3A21 21 weeks gestation of pregnancy: Secondary | ICD-10-CM | POA: Diagnosis not present

## 2020-08-21 DIAGNOSIS — O4402 Placenta previa specified as without hemorrhage, second trimester: Secondary | ICD-10-CM | POA: Diagnosis not present

## 2020-08-21 DIAGNOSIS — O0932 Supervision of pregnancy with insufficient antenatal care, second trimester: Secondary | ICD-10-CM | POA: Diagnosis not present

## 2020-08-21 DIAGNOSIS — O34219 Maternal care for unspecified type scar from previous cesarean delivery: Secondary | ICD-10-CM | POA: Diagnosis not present

## 2020-08-21 DIAGNOSIS — O4412 Placenta previa with hemorrhage, second trimester: Secondary | ICD-10-CM | POA: Diagnosis not present

## 2020-09-04 ENCOUNTER — Encounter: Payer: Medicaid Other | Admitting: Obstetrics and Gynecology

## 2020-10-31 DIAGNOSIS — Z3045 Encounter for surveillance of transdermal patch hormonal contraceptive device: Secondary | ICD-10-CM | POA: Diagnosis not present

## 2021-10-23 IMAGING — US US RENAL
1 series · 14 of 25 positions shown · non-contrast
Comparison: None.

CLINICAL DATA: Right flank pain, pregnant

EXAM:
RENAL / URINARY TRACT ULTRASOUND COMPLETE

[Series 1: us renal · 14 of 27 slices shown]
[im 1/27]
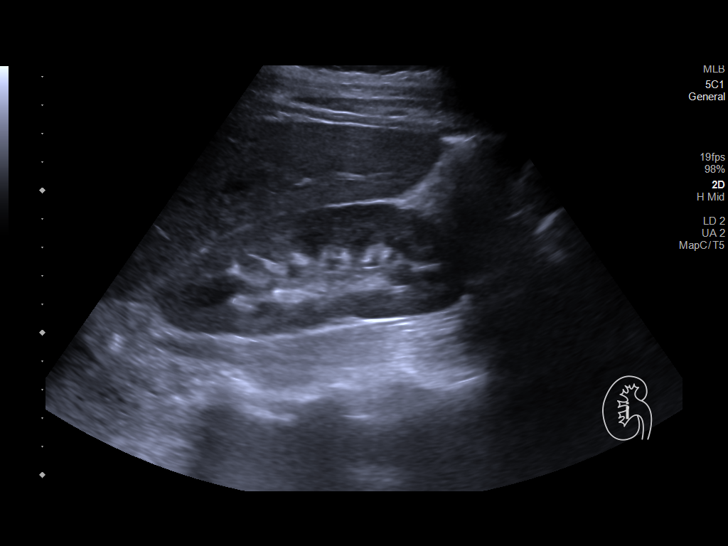
[im 3/27]
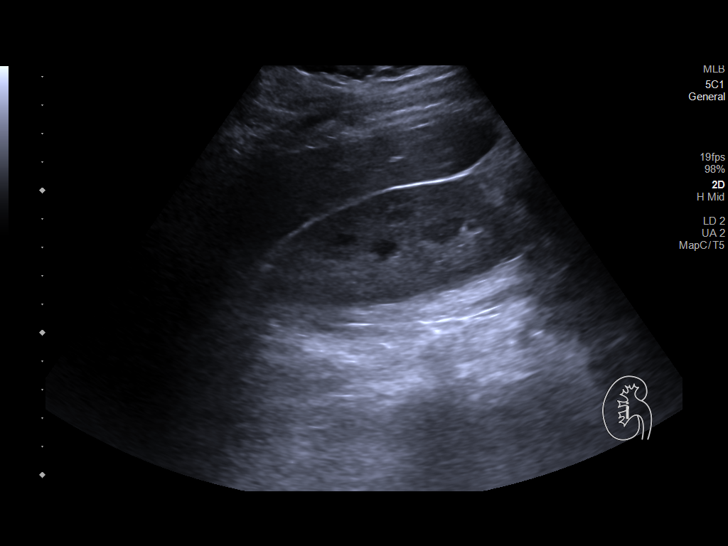
[im 5/27]
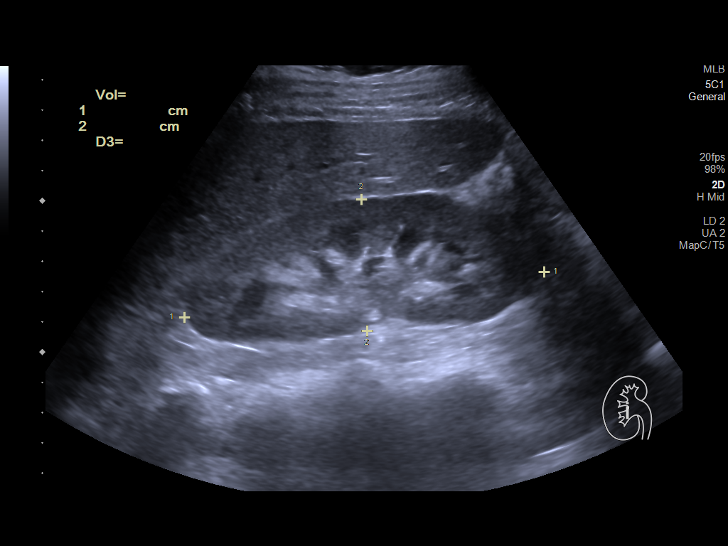
[im 7/27]
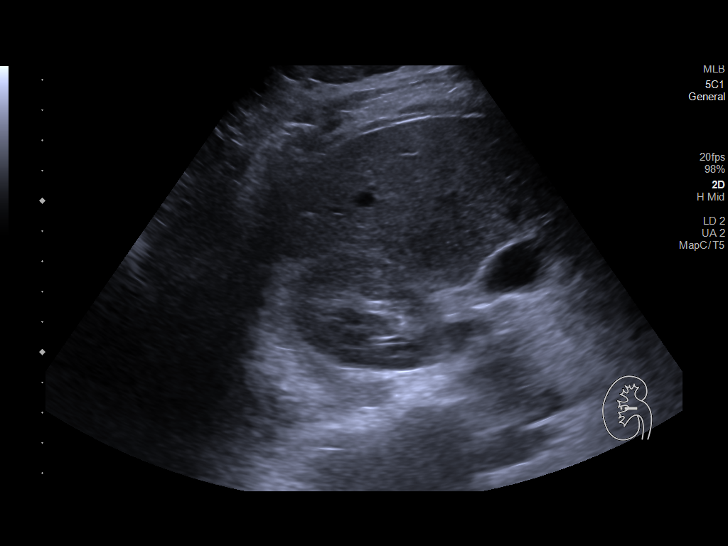
[im 9/27]
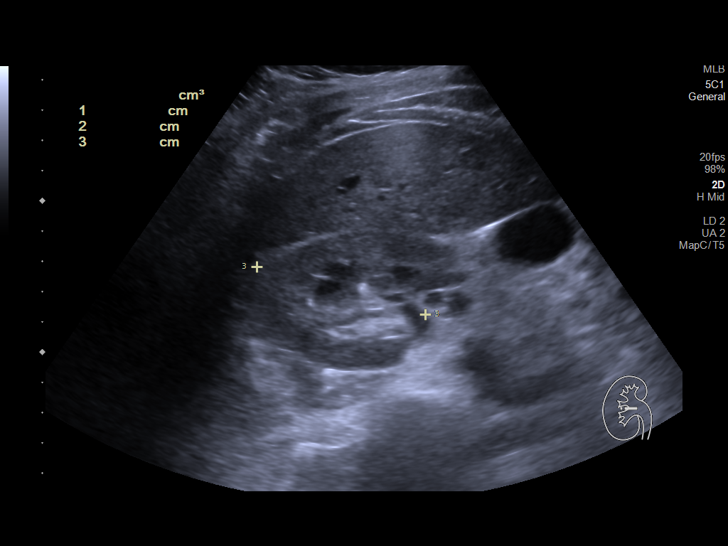
[im 10/27]
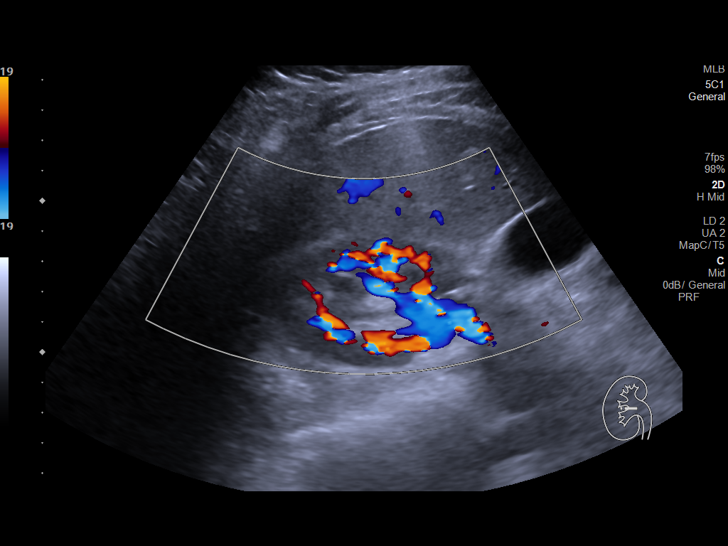
[im 12/27]
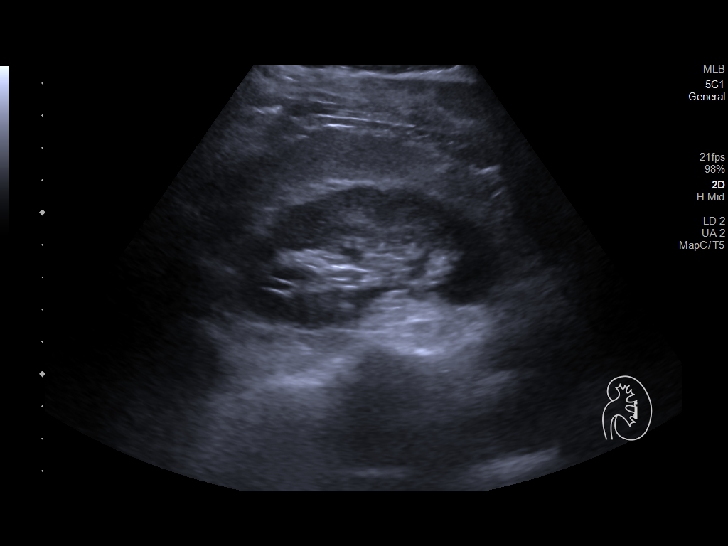
[im 15/27]
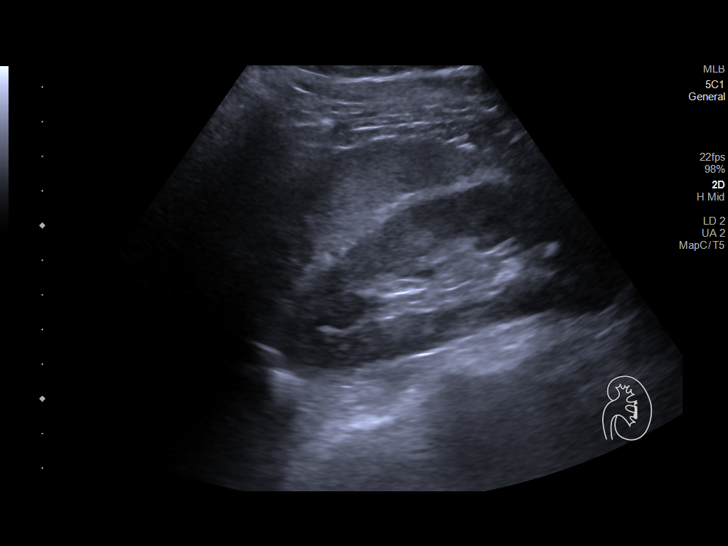
[im 17/27]
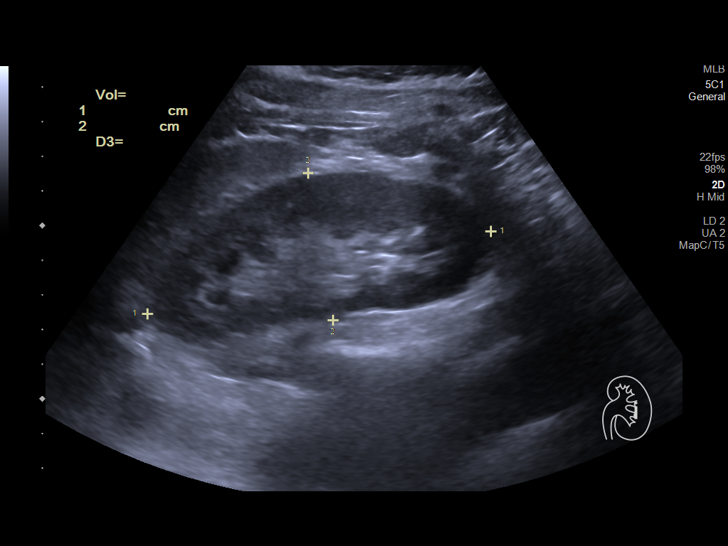
[im 18/27]
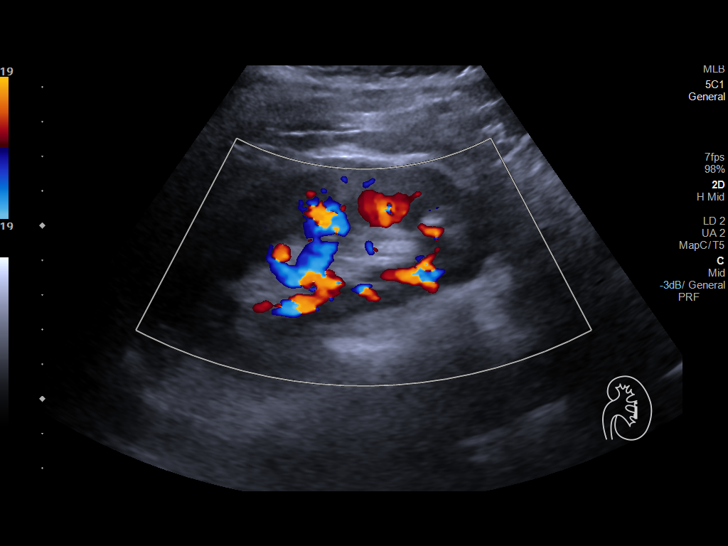
[im 20/27]
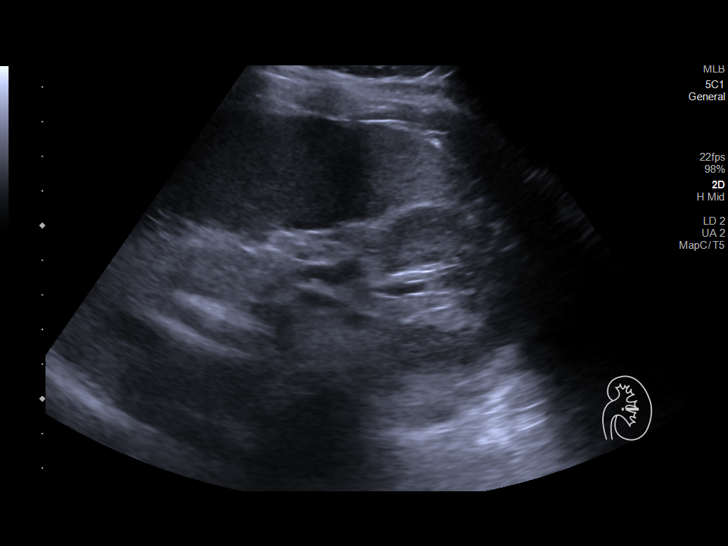
[im 22/27]
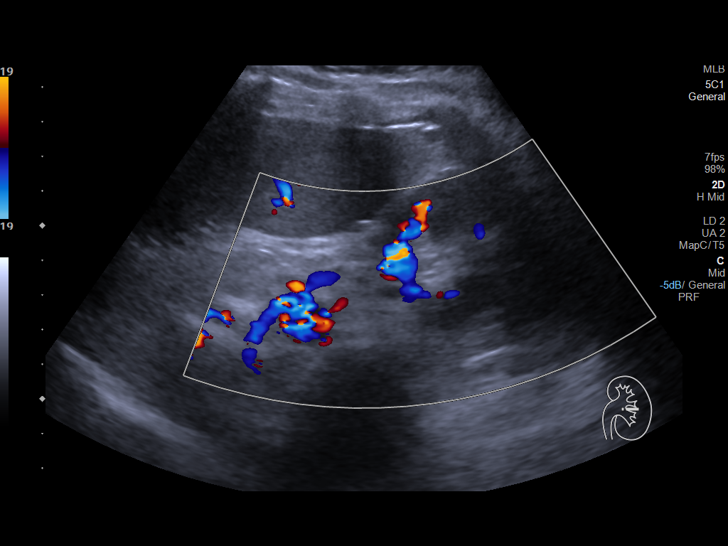
[im 24/27]
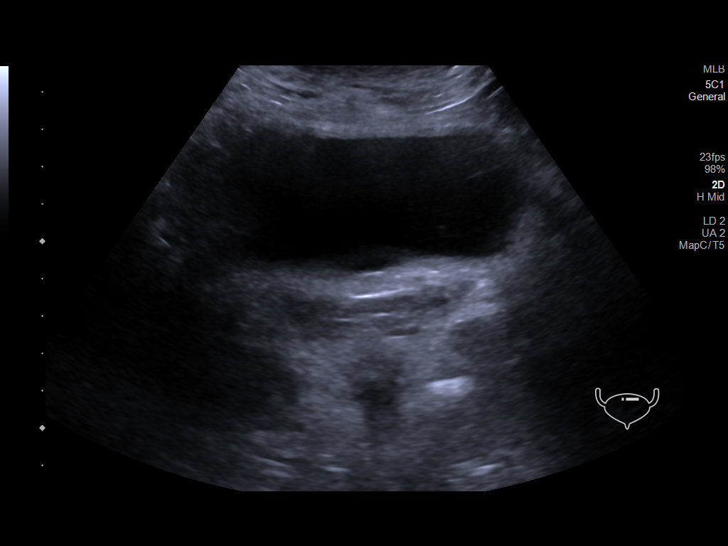
[im 27/27]
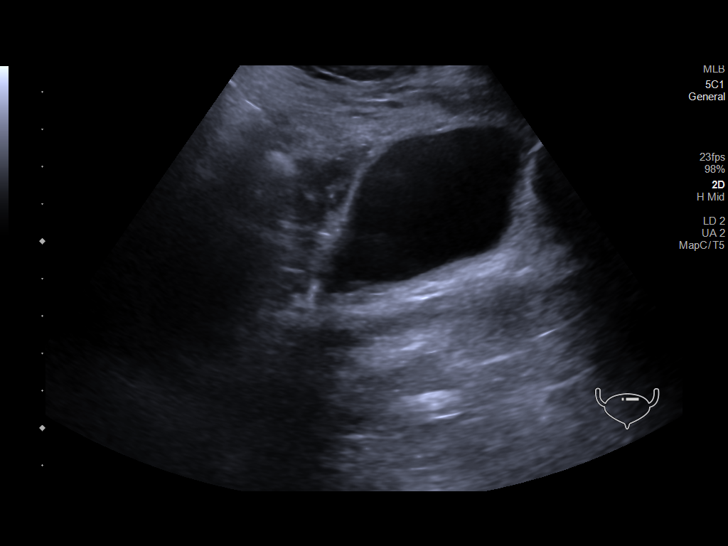

[14 of 25 positions shown; findings below may reference images not displayed]

FINDINGS: Right Kidney:

Renal measurements: 12 x 4.4 x 5.8 cm = volume: 158 mL .
Echogenicity within normal limits. No mass or hydronephrosis
visualized.

Left Kidney:

Renal measurements: 10.2 x 4.3 x 3.9 cm = volume: 89.9 mL.
Echogenicity within normal limits. No mass or hydronephrosis
visualized.

Bladder:

Appears normal for degree of bladder distention.

Other:

None.
IMPRESSION: Normal renal ultrasound.

## 2022-02-02 ENCOUNTER — Telehealth: Payer: Self-pay | Admitting: *Deleted

## 2022-02-02 NOTE — Telephone Encounter (Signed)
.. ?  Medicaid Managed Care  ? ?Unsuccessful Outreach Note ? ?02/02/2022 ?Name: Darlene Reynolds MRN: [id number] DOB: 05/29/1996 ? ?Referred by: Department, [facility] ?Reason for referral : High Risk Managed Medicaid (I called the patient today to get her scheduled with the [facility] Care Management Team. Her number was not in working order.) ? ? ?An unsuccessful telephone outreach was attempted today. The patient was referred to the case management team for assistance with care management and care coordination.  ? ?Follow Up Plan: The care management team will reach out to the patient again over the next 14 days.  ? ? ?Weston Settle ?Care Guide, High Risk Medicaid Managed Care ?Embedded Care Coordination ?[place]  [facility]  ? ? ?SIGNATURE  ?

## 2022-02-12 DIAGNOSIS — R9389 Abnormal findings on diagnostic imaging of other specified body structures: Secondary | ICD-10-CM | POA: Diagnosis not present

## 2022-02-12 DIAGNOSIS — R531 Weakness: Secondary | ICD-10-CM | POA: Diagnosis not present

## 2022-02-12 DIAGNOSIS — O4690 Antepartum hemorrhage, unspecified, unspecified trimester: Secondary | ICD-10-CM | POA: Diagnosis not present

## 2022-02-12 DIAGNOSIS — R42 Dizziness and giddiness: Secondary | ICD-10-CM | POA: Diagnosis not present

## 2022-02-12 DIAGNOSIS — R109 Unspecified abdominal pain: Secondary | ICD-10-CM | POA: Diagnosis not present

## 2022-02-12 DIAGNOSIS — F17210 Nicotine dependence, cigarettes, uncomplicated: Secondary | ICD-10-CM | POA: Diagnosis not present

## 2022-02-12 DIAGNOSIS — N939 Abnormal uterine and vaginal bleeding, unspecified: Secondary | ICD-10-CM | POA: Diagnosis not present

## 2022-02-12 DIAGNOSIS — Z79899 Other long term (current) drug therapy: Secondary | ICD-10-CM | POA: Diagnosis not present

## 2022-02-12 DIAGNOSIS — O039 Complete or unspecified spontaneous abortion without complication: Secondary | ICD-10-CM | POA: Diagnosis not present

## 2022-02-12 DIAGNOSIS — O99019 Anemia complicating pregnancy, unspecified trimester: Secondary | ICD-10-CM | POA: Diagnosis not present

## 2022-02-12 DIAGNOSIS — O99330 Smoking (tobacco) complicating pregnancy, unspecified trimester: Secondary | ICD-10-CM | POA: Diagnosis not present

## 2022-05-28 IMAGING — US US MFM OB DETAIL+14 WK
1 series · 13 of 28 positions shown · non-contrast
Comparison: none

[Series 1: us mfm ob detail+14 wk · 53 acquisitions, 13 frames shown]
[im 2/53]
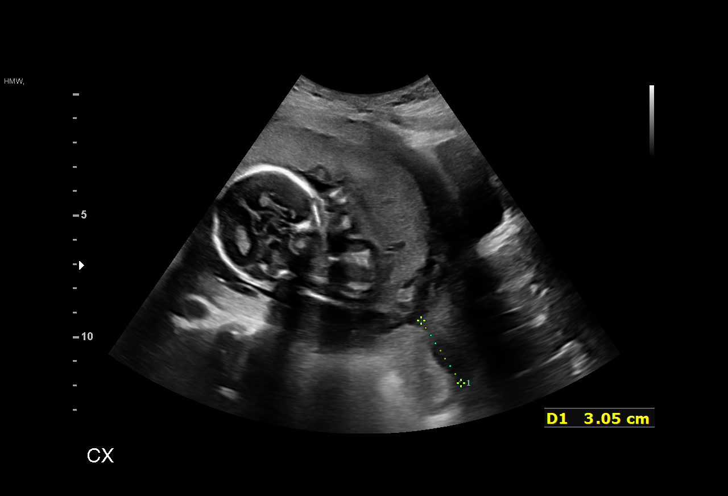
[im 6/53]
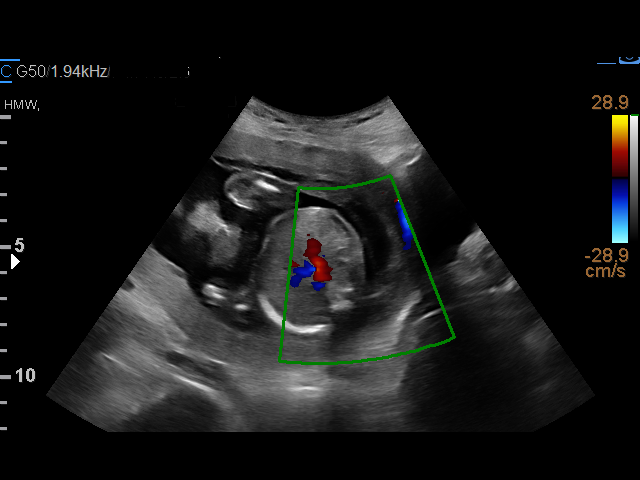
[im 10/53]
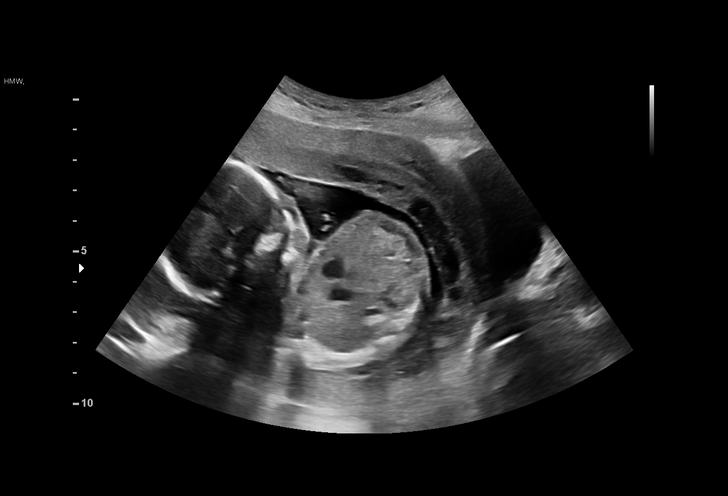
[im 14/53]
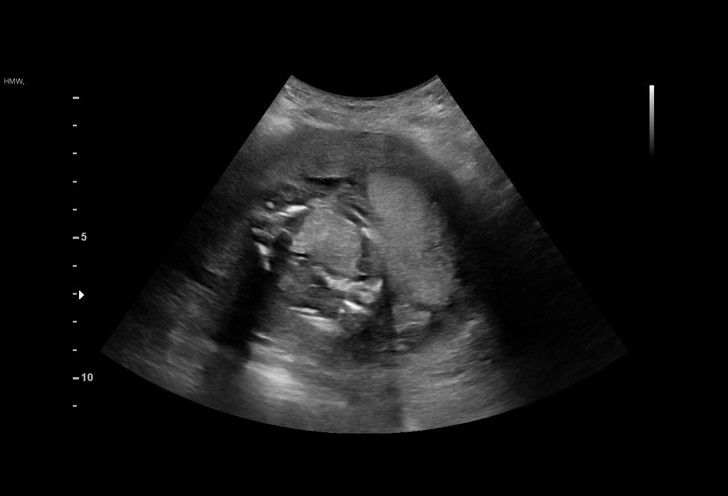
[im 18/53]
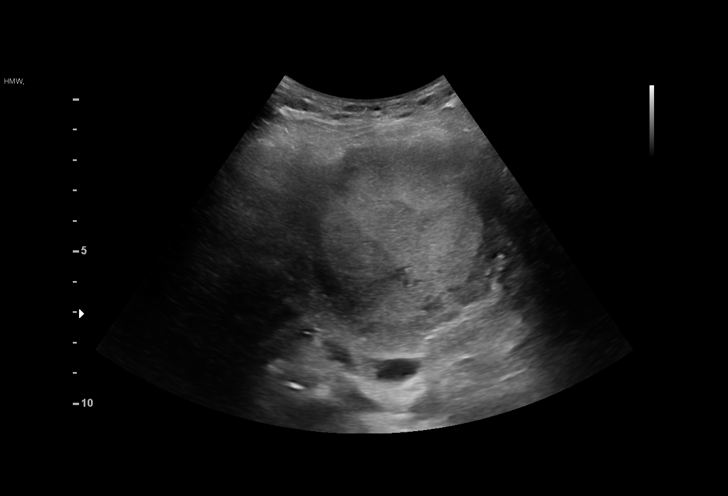
[im 22/53]
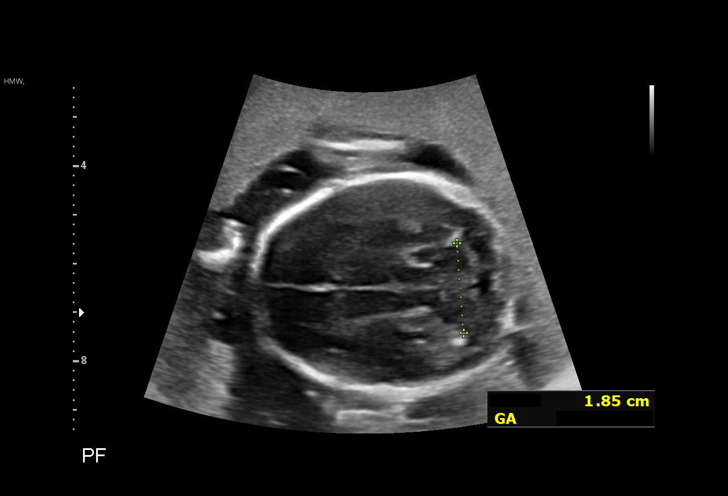
[im 27/53]
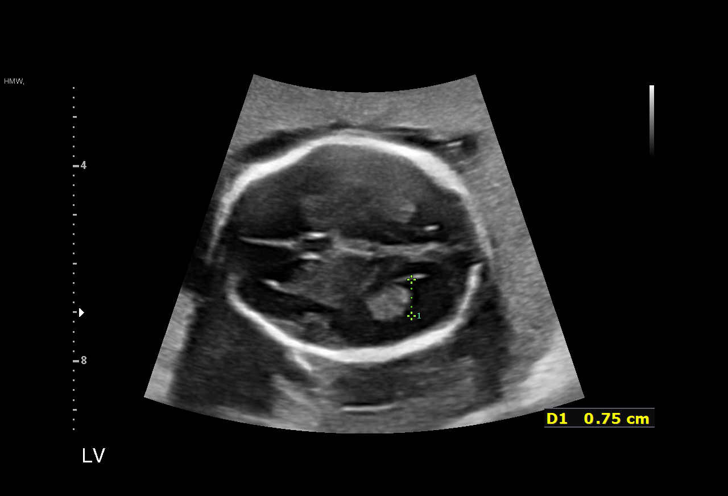
[im 31/53]
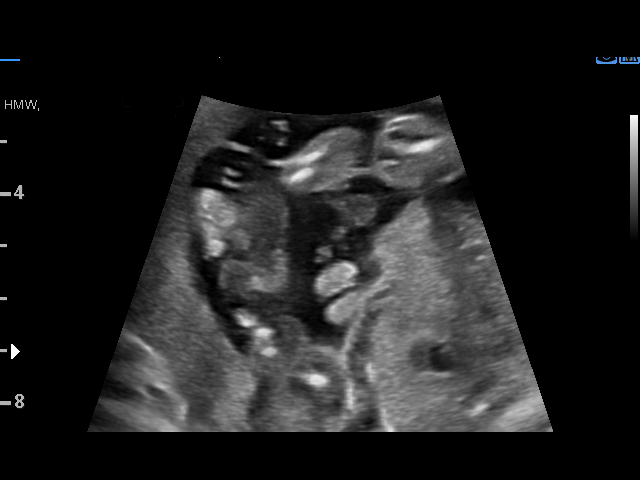
[im 35/53]
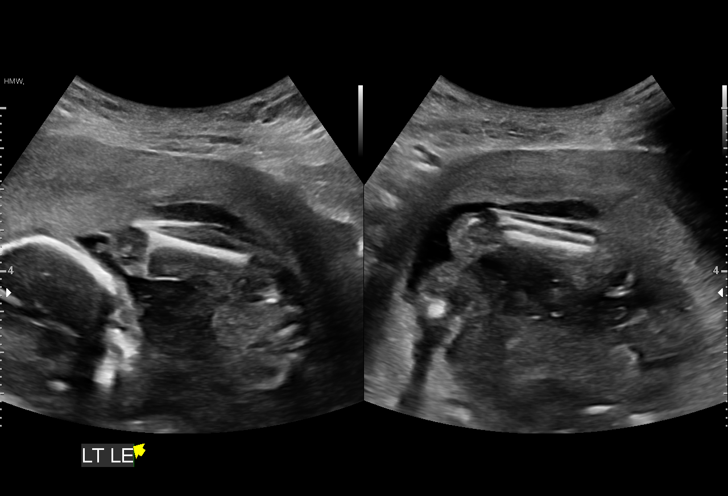
[im 39/53]
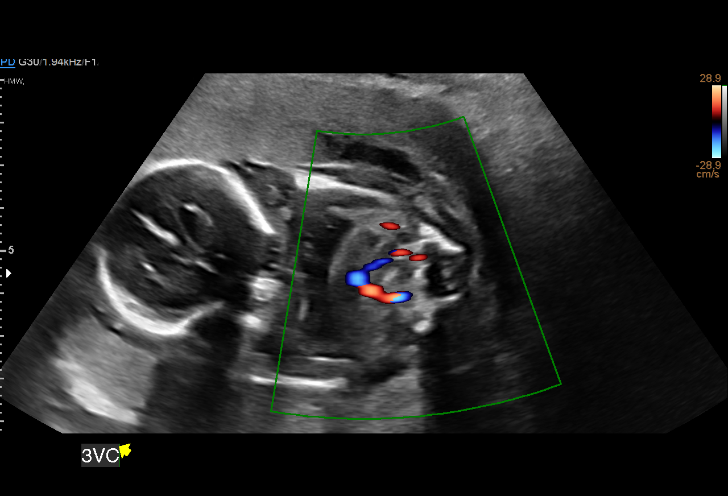
[im 43/53]
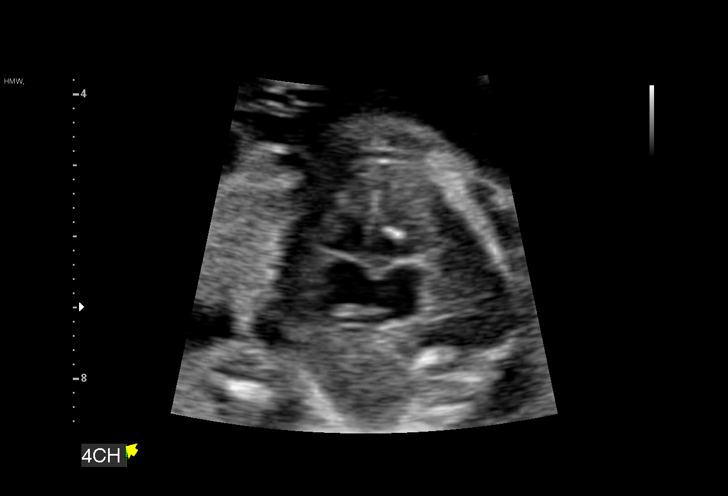
[im 47/53]
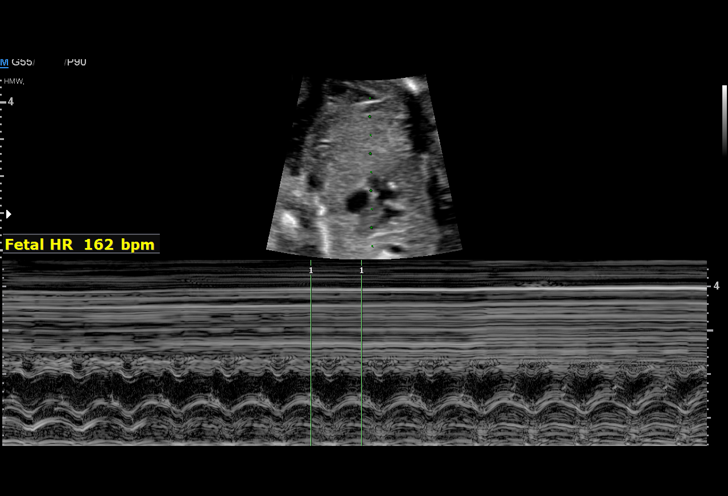
[im 51/53]
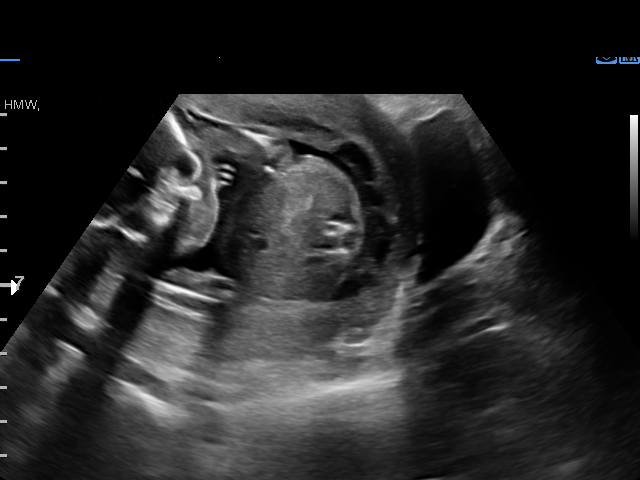

[13 of 28 positions shown; findings below may reference images not displayed]

[REDACTED]care

Indications

 Vaginal bleeding in pregnancy, second
 trimester
 19 weeks gestation of pregnancy
 Encounter for uncertain dates
 Previous cesarean delivery, antepartum x2
 Insufficient Prenatal Care
Fetal Evaluation

 Num Of Fetuses:         1
 Fetal Heart Rate(bpm):  162
 Cardiac Activity:       Observed
 Presentation:           Breech
 Placenta:               Anterior
 P. Cord Insertion:      Not well visualized

 Amniotic Fluid
 AFI FV:      Within normal limits

                             Largest Pocket(cm)


 Comment:    Placental abruption noted on the distal edge towards the cervix,
             abruption noted covering internal os measures 4.6 x 2.1x 5.0cm
Biometry

 BPD:      44.4  mm     G. Age:  19w 3d         69  %    CI:        79.31   %    70 - 86
                                                         FL/HC:      15.9   %    16.1 -
 HC:      157.6  mm     G. Age:  18w 5d         26  %    HC/AC:      1.05        1.09 -
 AC:      149.4  mm     G. Age:  20w 1d         82  %    FL/BPD:     56.5   %
 FL:       25.1  mm     G. Age:  17w 4d          6  %    FL/AC:      16.8   %    20 - 24
 HUM:      25.5  mm     G. Age:  18w 0d         22  %
 CER:      18.5  mm     G. Age:  18w 2d          9  %
 CM:        4.6  mm

 Est. FW:     267  gm      0 lb 9 oz     44  %
OB History

 Gravidity:    4         Term:   2        Prem:   0        SAB:   0
 TOP:          1       Ectopic:  0        Living: 2
Gestational Age

 LMP:           22w 6d        Date:  02/23/20                 EDD:   11/29/20
 U/S Today:     19w 0d                                        EDD:   12/26/20
 Best:          19w 0d     Det. By:  U/S (08/01/20)           EDD:   12/26/20
Anatomy

 Cranium:               Appears normal         LVOT:                   Not well visualized
 Cavum:                 Appears normal         Aortic Arch:            Not well visualized
 Ventricles:            Appears normal         Ductal Arch:            Not well visualized
 Choroid Plexus:        Appears normal         Diaphragm:              Not well visualized
 Cerebellum:            Appears normal         Stomach:                Appears normal, left
                                                                       sided
 Posterior Fossa:       Appears normal         Abdomen:                Appears normal
 Nuchal Fold:           Not well visualized    Abdominal Wall:         Appears nml (cord
                                                                       insert, abd wall)
 Face:                  Not well visualized    Cord Vessels:           Appears normal (3
                                                                       vessel cord)
 Lips:                  Appears normal         Kidneys:                Not well visualized
 Palate:                Not well visualized    Bladder:                Appears normal
 Thoracic:              Appears normal         Spine:                  Not well visualized
 Heart:                 Appears normal; EIF    Upper Extremities:      Visualized
 RVOT:                  Not well visualized    Lower Extremities:      Visualized

 Other:  Fetus appears to be female. Heels visualized. Technically difficult due
         to fetal position.
Cervix Uterus Adnexa

 Cervix
 Length:            3.1  cm.
 Closed . Normal appearance by transabdominal scan.

 Uterus
 No abnormality visualized.

 Right Ovary
 No adnexal mass visualized.

 Left Ovary
 No adnexal mass visualized.

 Cul De Sac
 No free fluid seen.

 Adnexa
 No abnormality visualized.
Impression

 G4 P2.  Patient with uncertain LMP date is being evaluated at
 the [facility] for complaints of vaginal bleeding.

 On ultrasound, fetal biometry is consistent with 19 weeks
 gestation.  Amniotic fluid is normal and good fetal activity
 seen.  An echogenic intracardiac focus is seen.  No other
 markers of aneuploidies or obvious fetal structural defects
 are seen.  An echogenic material at the inferior edge of the
 placenta toward the internal os, measuring 4.6 x 2.5 x 5 cm,
 is seen.  This is consistent with subchorionic
 hematoma/abruption given that she has vaginal bleeding.

 Placenta is anterior and there is no evidence of previa or
 accreta (history of 2 cesarean sections.  On transabdominal
 scan, the cervix looks long and closed.

 Echogenic focus is seen in about 3% to 4% of normal
 fetuses.  It is also a soft marker for Down syndrome.  I
 recommend that she be offered  screening for aneuploidies at
 her prenatal visit.  If screening is negative for Down
 syndrome this finding should be considered a normal variant.

 Blood type: B positive.
 Discussed with [facility] team.
Recommendations

 Follow-up scan in 4 weeks to complete fetal anatomical
 survey.
                 [place]man, Klpigbb

## 2022-06-03 IMAGING — US US MFM OB LIMITED
1 series · 15 of 26 positions shown · non-contrast
Comparison: none

[Series 1: us mfm ob limited · 26 acquisitions, 15 frames shown]
[im 1/26]
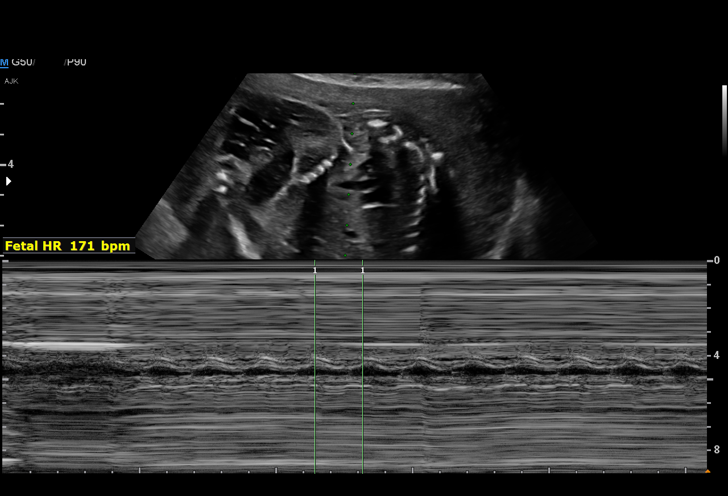
[im 3/26]
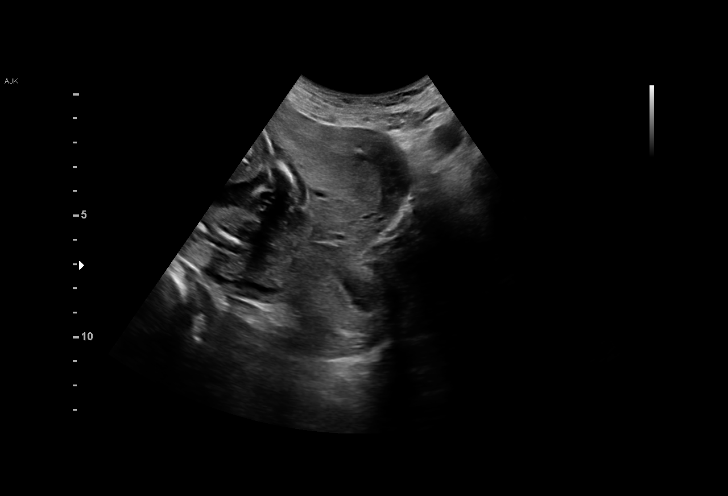
[im 5/26]
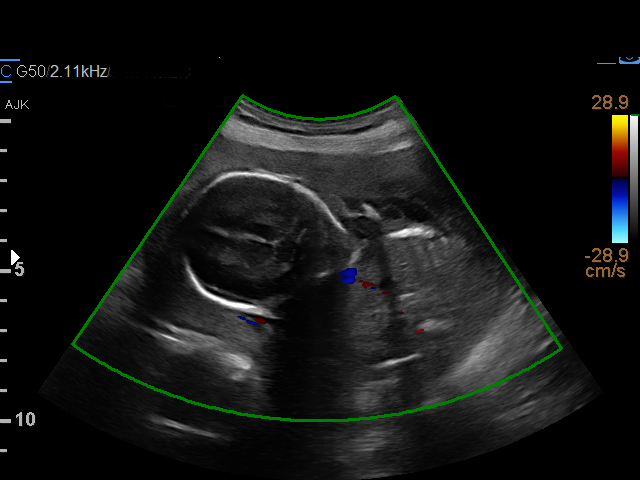
[im 7/26]
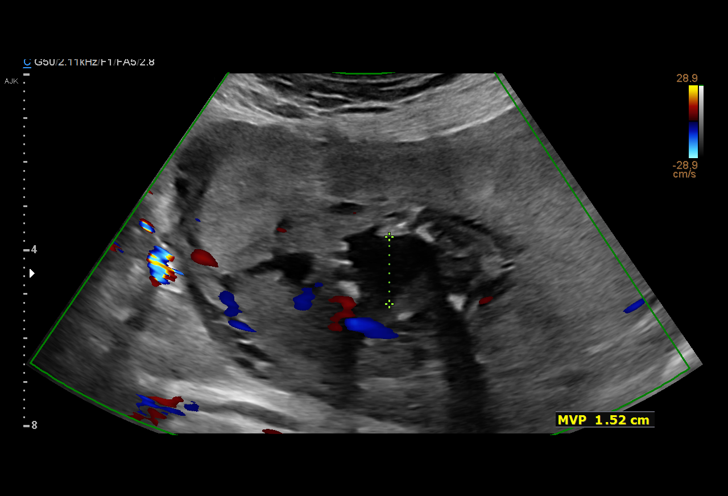
[im 8/26]
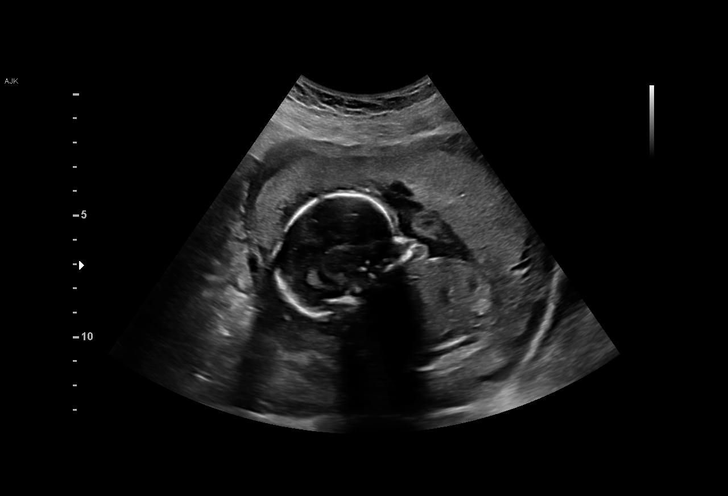
[im 10/26]
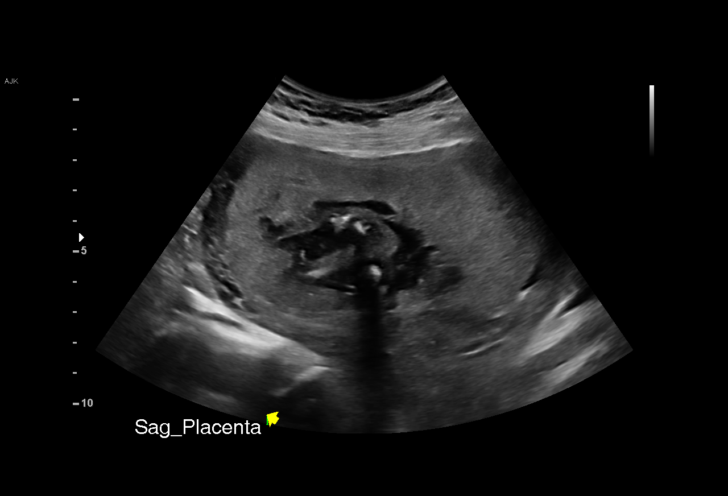
[im 12/26]
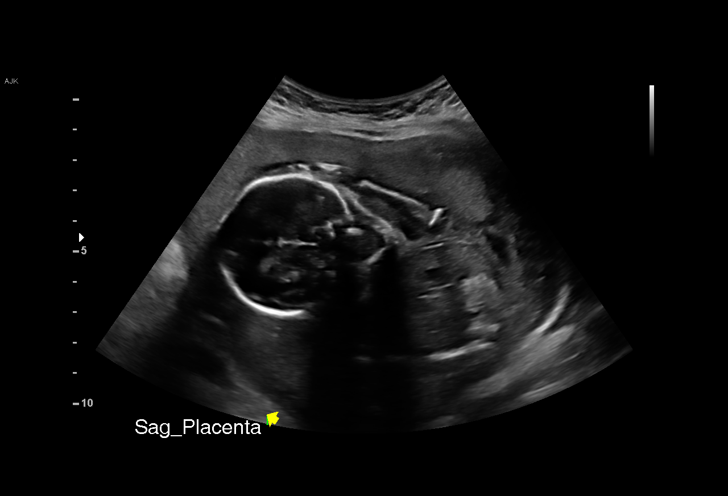
[im 14/26]
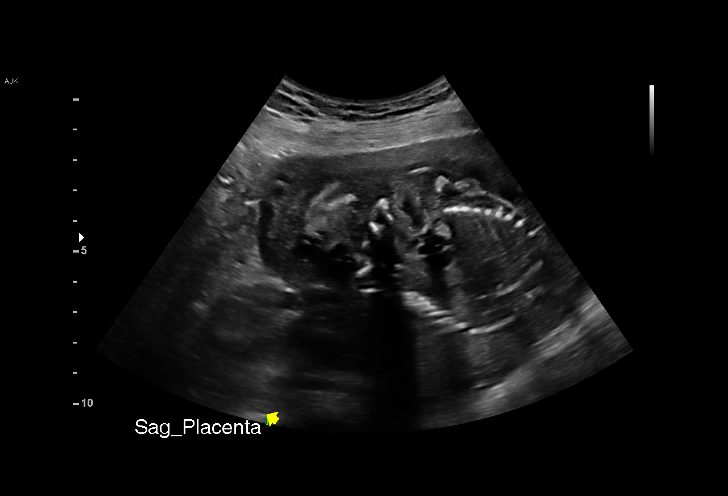
[im 15/26]
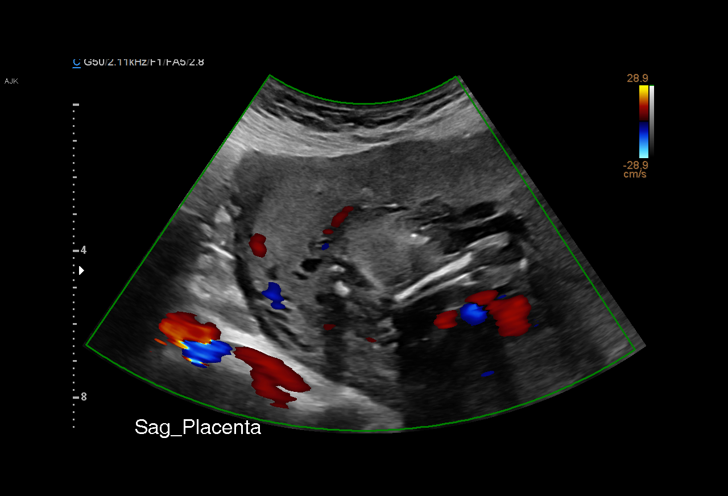
[im 17/26]
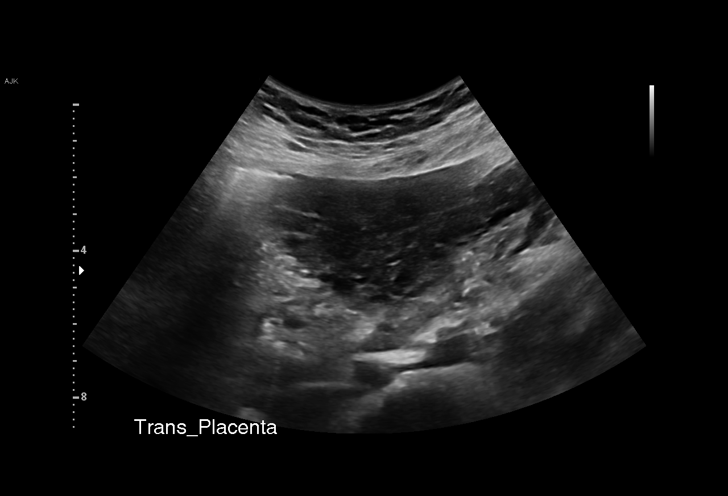
[im 19/26]
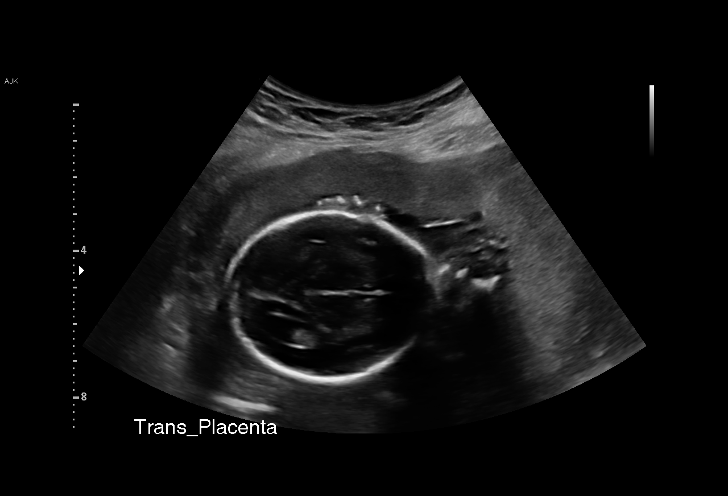
[im 20/26]
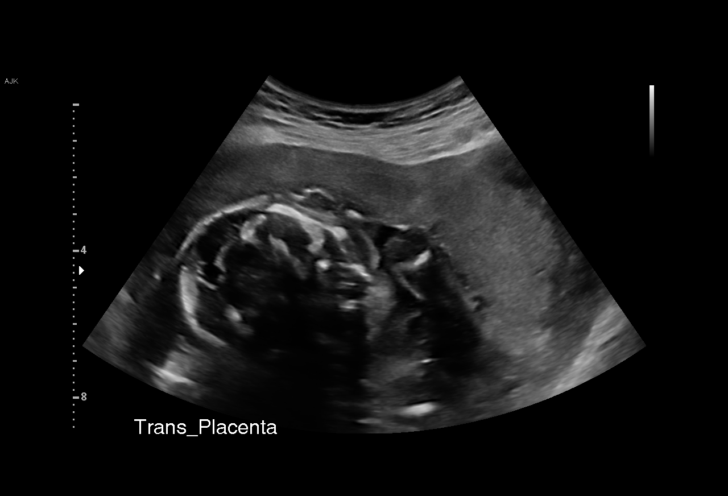
[im 22/26]
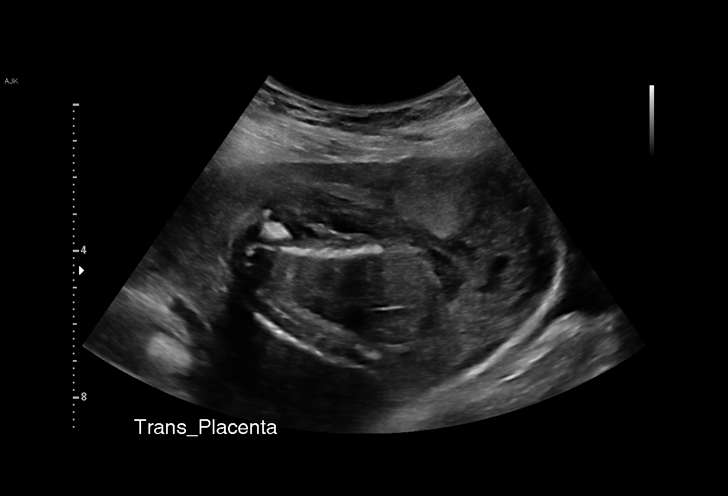
[im 24/26]
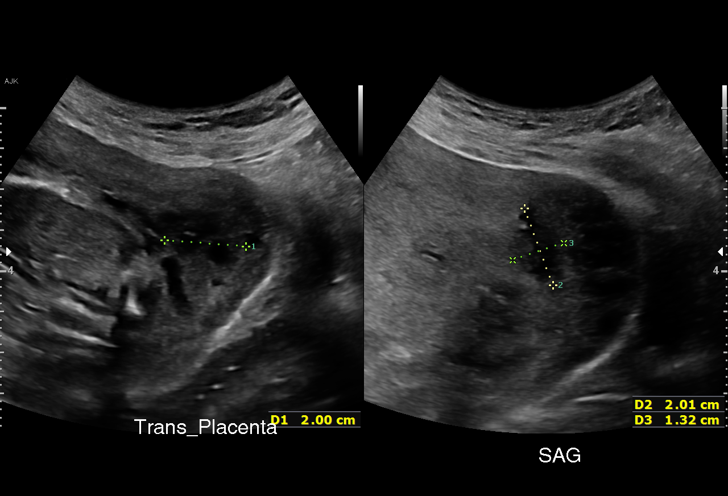
[im 26/26]
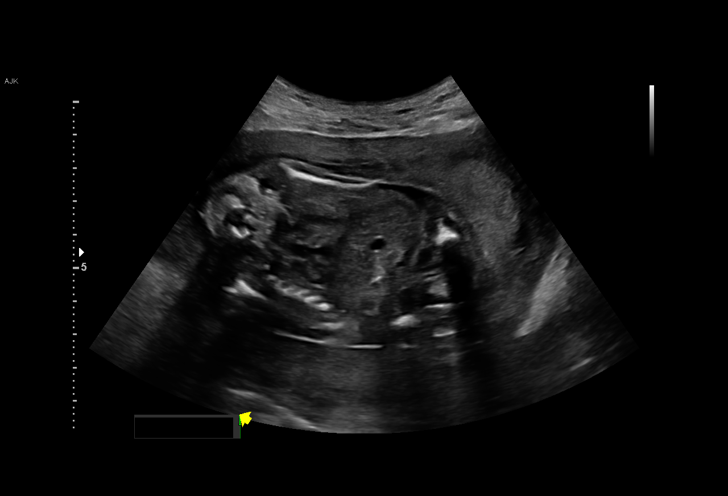

[15 of 26 positions shown; findings below may reference images not displayed]

[REDACTED]care

 1  US MFM OB LIMITED                     76815.01    JM PERLMAN

Indications

 19 weeks gestation of pregnancy
 Vaginal bleeding in pregnancy, second
 trimester
Fetal Evaluation

 Num Of Fetuses:          1
 Fetal Heart Rate(bpm):   171
 Cardiac Activity:        Observed
 Presentation:            Breech
 Placenta:                Anterior

 Amniotic Fluid
 AFI FV:      Oligohydramnios

                             Largest Pocket(cm)


 Comment:    ? area of abruption (2.0cm x4.4cm x1.6cm)
OB History

 Gravidity:    4         Term:   2        Prem:   0        SAB:   0
 TOP:          1       Ectopic:  0        Living: 2
Gestational Age

 LMP:           23w 5d        Date:  02/23/20                 EDD:   11/29/20
 Best:          19w 6d     Det. By:  U/S  (08/01/20)          EDD:   12/26/20
Cervix Uterus Adnexa
 Cervix
 Length:           3.06  cm.
Impression

 Limited exam to assess amniotic fluid and for complaints of
 maternal vaginal bleeding.
 Good fetal movement however, oligohydramnios is observed
 with an MVP of
Recommendations

 Management per inpatient provider.

## 2022-06-10 DIAGNOSIS — H5213 Myopia, bilateral: Secondary | ICD-10-CM | POA: Diagnosis not present
